# Patient Record
Sex: Male | Born: 1985 | Race: Black or African American | Hispanic: No | Marital: Single | Smoking: Never smoker
Health system: Southern US, Community
[De-identification: ages and names within clinical notes are randomized; demographics above are authoritative.]

## PROBLEM LIST (undated history)

## (undated) DIAGNOSIS — R519 Headache, unspecified: Secondary | ICD-10-CM

## (undated) HISTORY — PX: HERNIA REPAIR: SHX51

## (undated) HISTORY — DX: Headache, unspecified: R51.9

## (undated) HISTORY — PX: ASD REPAIR: SHX258

---
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
## 2008-05-09 ENCOUNTER — Ambulatory Visit: Payer: Self-pay | Admitting: Internal Medicine

## 2009-04-21 ENCOUNTER — Ambulatory Visit: Payer: Self-pay | Admitting: Internal Medicine

## 2010-02-27 ENCOUNTER — Emergency Department (HOSPITAL_COMMUNITY): Admission: EM | Admit: 2010-02-27 | Discharge: 2010-02-28 | Payer: Self-pay | Admitting: Emergency Medicine

## 2010-07-25 ENCOUNTER — Ambulatory Visit: Payer: Self-pay | Admitting: Internal Medicine

## 2010-08-03 ENCOUNTER — Ambulatory Visit
Admission: RE | Admit: 2010-08-03 | Discharge: 2010-08-03 | Payer: Self-pay | Source: Home / Self Care | Attending: Internal Medicine | Admitting: Internal Medicine

## 2010-09-03 ENCOUNTER — Ambulatory Visit: Admit: 2010-09-03 | Payer: Self-pay | Admitting: Internal Medicine

## 2010-09-04 ENCOUNTER — Ambulatory Visit: Admit: 2010-09-04 | Payer: Self-pay | Admitting: Internal Medicine

## 2010-09-06 ENCOUNTER — Encounter (INDEPENDENT_AMBULATORY_CARE_PROVIDER_SITE_OTHER): Payer: BC Managed Care – PPO | Admitting: Internal Medicine

## 2010-09-06 DIAGNOSIS — Z23 Encounter for immunization: Secondary | ICD-10-CM

## 2011-05-06 ENCOUNTER — Ambulatory Visit: Payer: BC Managed Care – PPO | Admitting: Internal Medicine

## 2011-05-06 ENCOUNTER — Encounter: Payer: Self-pay | Admitting: Internal Medicine

## 2012-04-27 ENCOUNTER — Telehealth: Payer: Self-pay | Admitting: Internal Medicine

## 2012-04-28 NOTE — Telephone Encounter (Signed)
There is no cream to remove them.  They're difficult to remove and can only be done by plastic surgeon by laser.  Pt notified.

## 2012-10-08 ENCOUNTER — Ambulatory Visit (INDEPENDENT_AMBULATORY_CARE_PROVIDER_SITE_OTHER): Payer: BC Managed Care – PPO | Admitting: Internal Medicine

## 2012-10-08 ENCOUNTER — Encounter: Payer: Self-pay | Admitting: Internal Medicine

## 2012-10-08 VITALS — BP 120/88 | Temp 97.0°F | Ht 69.0 in | Wt 163.0 lb

## 2012-10-08 DIAGNOSIS — Z9861 Coronary angioplasty status: Secondary | ICD-10-CM

## 2012-10-08 DIAGNOSIS — Z0000 Encounter for general adult medical examination without abnormal findings: Secondary | ICD-10-CM

## 2012-10-08 DIAGNOSIS — Z8774 Personal history of (corrected) congenital malformations of heart and circulatory system: Secondary | ICD-10-CM

## 2012-10-08 DIAGNOSIS — Z202 Contact with and (suspected) exposure to infections with a predominantly sexual mode of transmission: Secondary | ICD-10-CM

## 2012-10-08 DIAGNOSIS — Z113 Encounter for screening for infections with a predominantly sexual mode of transmission: Secondary | ICD-10-CM

## 2012-10-08 DIAGNOSIS — Z2089 Contact with and (suspected) exposure to other communicable diseases: Secondary | ICD-10-CM

## 2012-10-08 DIAGNOSIS — Z23 Encounter for immunization: Secondary | ICD-10-CM

## 2012-10-08 LAB — CBC WITH DIFFERENTIAL/PLATELET
Basophils Absolute: 0.1 10*3/uL (ref 0.0–0.1)
Basophils Relative: 1 % (ref 0–1)
Eosinophils Absolute: 0.3 10*3/uL (ref 0.0–0.7)
Eosinophils Relative: 6 % — ABNORMAL HIGH (ref 0–5)
HCT: 47.1 % (ref 39.0–52.0)
Hemoglobin: 15.5 g/dL (ref 13.0–17.0)
Lymphocytes Relative: 41 % (ref 12–46)
Lymphs Abs: 2.3 10*3/uL (ref 0.7–4.0)
MCH: 26.7 pg (ref 26.0–34.0)
MCHC: 32.9 g/dL (ref 30.0–36.0)
MCV: 81.1 fL (ref 78.0–100.0)
Monocytes Absolute: 0.3 10*3/uL (ref 0.1–1.0)
Monocytes Relative: 5 % (ref 3–12)
Neutro Abs: 2.6 10*3/uL (ref 1.7–7.7)
Neutrophils Relative %: 47 % (ref 43–77)
Platelets: 278 10*3/uL (ref 150–400)
RBC: 5.81 MIL/uL (ref 4.22–5.81)
RDW: 14.2 % (ref 11.5–15.5)
WBC: 5.6 10*3/uL (ref 4.0–10.5)

## 2012-10-08 LAB — POCT URINALYSIS DIPSTICK
Bilirubin, UA: NEGATIVE
Blood, UA: NEGATIVE
Glucose, UA: NEGATIVE
Ketones, UA: NEGATIVE
Leukocytes, UA: NEGATIVE
Nitrite, UA: NEGATIVE
Protein, UA: NEGATIVE
Spec Grav, UA: 1.01
Urobilinogen, UA: NEGATIVE
pH, UA: 7

## 2012-10-08 LAB — COMPREHENSIVE METABOLIC PANEL
ALT: 15 U/L (ref 0–53)
AST: 26 U/L (ref 0–37)
Albumin: 4.4 g/dL (ref 3.5–5.2)
Alkaline Phosphatase: 69 U/L (ref 39–117)
BUN: 18 mg/dL (ref 6–23)
CO2: 28 mEq/L (ref 19–32)
Calcium: 10.2 mg/dL (ref 8.4–10.5)
Chloride: 101 mEq/L (ref 96–112)
Creat: 1.01 mg/dL (ref 0.50–1.35)
Glucose, Bld: 87 mg/dL (ref 70–99)
Potassium: 4.4 mEq/L (ref 3.5–5.3)
Sodium: 136 mEq/L (ref 135–145)
Total Bilirubin: 0.6 mg/dL (ref 0.3–1.2)
Total Protein: 7 g/dL (ref 6.0–8.3)

## 2012-10-08 LAB — LIPID PANEL
Cholesterol: 143 mg/dL (ref 0–200)
HDL: 55 mg/dL (ref >39)
LDL Cholesterol: 78 mg/dL (ref 0–99)
Total CHOL/HDL Ratio: 2.6 Ratio
Triglycerides: 51 mg/dL (ref <150)
VLDL: 10 mg/dL (ref 0–40)

## 2012-10-08 LAB — HIV ANTIBODY (ROUTINE TESTING W REFLEX): HIV: NONREACTIVE

## 2012-10-08 MED ORDER — TETANUS-DIPHTH-ACELL PERTUSSIS 5-2.5-18.5 LF-MCG/0.5 IM SUSP: 0.5000 mL | Freq: Once | INTRAMUSCULAR | Status: DC

## 2012-10-08 NOTE — Patient Instructions (Addendum)
Use protection with sexual intercourse. Return one to 2 years or as needed

## 2012-10-08 NOTE — Progress Notes (Signed)
Subjective:    Patient ID: Jose Phillips, male    DOB: [birth year], 27 y.o.   MRN: [id number]  HPI  27 year old Black male attending  [name] has been a patient here since birth in [birth year]. I initially saw him as an infant at [facility]. He was a 6 lbs. 13 oz. product of normal pregnancy. He had bilateral inguinal hernia repair October 1989. He has bilateral arm tattoos that are being removed with laser treatment at [facility]. 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 He had developed hypopigmented patches on his body. Biopsy showed mononuclear cell infiltrate.  No chronic medications. No known drug allergies.  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 Thinks he might want to major in exercise physiology. Wants to help people and has thought about a career in medicine.  Had chickenpox in 1990. Had ASD  transcatheter closure at age 79 at [facility]. He had a large oval fossa ASD  He had hep B series completed in 2012. Although he started the series in 1999 we could not see that he had had but 3 of the vaccines by April 2000. Therefore he got a dose December 2011 and another dose February 2012.Tdap vaccine given today. Was diagnosed in 2009 with GC and chlamydia. Says he's been careful with sexual intercourse. Wants to be checked for HIV.  Family history: Father with history of hypertension. One sister in good health. Mother with history of lumbar back and cervical neck pain.    Review of Systems  Constitutional: Negative.   HENT: Negative.   Eyes: Negative.   Respiratory: Negative.   Cardiovascular: Negative.   Gastrointestinal: Negative.   Endocrine: Negative.   Genitourinary: Negative.   Allergic/Immunologic: Negative.   Neurological: Negative.   Hematological: Negative.   Psychiatric/Behavioral: Negative.        Objective:   Physical Exam  Vitals reviewed. Constitutional: He is oriented to  person, place, and time. He appears well-developed and well-nourished. No distress.  HENT:  Head: Normocephalic and atraumatic.  Right Ear: External ear normal.  Left Ear: External ear normal.  Mouth/Throat: Oropharynx is clear and moist. No oropharyngeal exudate.  Eyes: Conjunctivae and EOM are normal. Pupils are equal, round, and reactive to light. Right eye exhibits no discharge. Left eye exhibits no discharge.  Neck: Neck supple. No JVD present. No thyromegaly present.  Cardiovascular: Normal rate, regular rhythm, normal heart sounds and intact distal pulses.   No murmur heard. Pulmonary/Chest: Effort normal and breath sounds normal. He has no wheezes.  Abdominal: Soft. Bowel sounds are normal. He exhibits no distension and no mass. There is no tenderness. There is no rebound and no guarding.  Genitourinary:  Penis normal. Scrotum normal. No hernias to direct palpation.  Musculoskeletal: He exhibits no edema.  Lymphadenopathy:    He has no cervical adenopathy.  Neurological: He is alert and oriented to person, place, and time. He has normal reflexes. No cranial nerve deficit. Coordination normal.  Skin: Skin is warm and dry. He is not diaphoretic.  Psychiatric: He has a normal mood and affect. His behavior is normal. Judgment and thought content normal.          Assessment & Plan:  History of ASD transcatheter closure 1988 at age 80  History of GC and Chlamydia in 2009  Normal health maintenance exam-labs are pending  Plan: Tdap Vaccine given today return in 1-2  years or as needed

## 2012-10-09 LAB — GC/CHLAMYDIA PROBE AMP, URINE
Chlamydia, Swab/Urine, PCR: NEGATIVE
GC Probe Amp, Urine: NEGATIVE

## 2013-01-11 ENCOUNTER — Other Ambulatory Visit: Payer: Self-pay | Admitting: Internal Medicine

## 2013-01-11 ENCOUNTER — Ambulatory Visit (INDEPENDENT_AMBULATORY_CARE_PROVIDER_SITE_OTHER): Payer: BC Managed Care – PPO | Admitting: Internal Medicine

## 2013-01-11 ENCOUNTER — Encounter: Payer: Self-pay | Admitting: Internal Medicine

## 2013-01-11 VITALS — BP 112/68 | HR 70 | Temp 96.6°F | Wt 165.0 lb

## 2013-01-11 DIAGNOSIS — Z711 Person with feared health complaint in whom no diagnosis is made: Secondary | ICD-10-CM

## 2013-01-11 NOTE — Progress Notes (Signed)
  Subjective:    Patient ID: Jose Phillips, male    DOB: October 01, 1985, 27 y.o.   MRN: [id number]  HPI 27 year old Black male in today for STD testing. Says he is not having as many partners as previously. Says he is using condoms. However he wants to be checked for STDs. No penile discharge.  He is majoring in business at [employer] with a concentration and exercise physiology. He wants to work in the hospital. I talked with him about going to physicians assistant school but he would have to take chemistry. He might consider getting an MBA.    Review of Systems     Objective:   Physical Exam not examined. He was screened today for hepatitis C and HIV via lab test. He did not want to have probes for GC and chlamydia. He agreed to do urine specimen for these.        Assessment & Plan:  STD testing  Plan: Await results.  Time spent with patient 15 minutes

## 2013-01-11 NOTE — Patient Instructions (Addendum)
Await test results. Remember to use condoms.

## 2013-01-12 ENCOUNTER — Telehealth: Payer: Self-pay | Admitting: *Deleted

## 2013-01-12 LAB — HIV ANTIBODY (ROUTINE TESTING W REFLEX): HIV: NONREACTIVE

## 2013-01-12 LAB — NEISSERIA GONORRHOEAE, PROBE AMP: GC Probe RNA: NEGATIVE

## 2013-01-12 NOTE — Telephone Encounter (Signed)
LVM, advising that per Dr. Lenord Fellers all labs were normal. He called back and confirmed results with Marcelino Duster. KW

## 2013-01-13 LAB — GC/CHLAMYDIA PROBE AMP, URINE: Chlamydia, Swab/Urine, PCR: NEGATIVE

## 2013-10-06 ENCOUNTER — Telehealth: Payer: Self-pay

## 2013-10-06 NOTE — Telephone Encounter (Signed)
States she believes her son is having migraine headaches. Has had 3 in the past few days. excedrin migraine not much help. Wanted to make appointment for him, which we did for tomorrrow, but advised her to take him to urgent care if he has nausea, vomiting, photophobia or blurred vision. She voices understanding.

## 2013-10-07 ENCOUNTER — Other Ambulatory Visit: Payer: Self-pay

## 2013-10-07 ENCOUNTER — Ambulatory Visit
Admission: RE | Admit: 2013-10-07 | Discharge: 2013-10-07 | Disposition: A | Discharge status: Home / Self Care | Payer: BC Managed Care – PPO | Source: Ambulatory Visit | Attending: Internal Medicine | Admitting: Internal Medicine

## 2013-10-07 ENCOUNTER — Ambulatory Visit (INDEPENDENT_AMBULATORY_CARE_PROVIDER_SITE_OTHER): Payer: BC Managed Care – PPO | Admitting: Internal Medicine

## 2013-10-07 ENCOUNTER — Encounter: Payer: Self-pay | Admitting: Internal Medicine

## 2013-10-07 VITALS — BP 116/90 | HR 72 | Temp 97.6°F | Ht 70.25 in | Wt 176.0 lb

## 2013-10-07 DIAGNOSIS — G43901 Migraine, unspecified, not intractable, with status migrainosus: Secondary | ICD-10-CM

## 2013-10-07 DIAGNOSIS — Z8669 Personal history of other diseases of the nervous system and sense organs: Secondary | ICD-10-CM

## 2013-10-07 DIAGNOSIS — R519 Headache, unspecified: Secondary | ICD-10-CM

## 2013-10-07 DIAGNOSIS — R51 Headache: Secondary | ICD-10-CM

## 2013-10-07 DIAGNOSIS — Z202 Contact with and (suspected) exposure to infections with a predominantly sexual mode of transmission: Secondary | ICD-10-CM

## 2013-10-07 LAB — CBC WITH DIFFERENTIAL/PLATELET
BASOS ABS: 0 10*3/uL (ref 0.0–0.1)
Basophils Relative: 1 % (ref 0–1)
EOS ABS: 0.2 10*3/uL (ref 0.0–0.7)
EOS PCT: 4 % (ref 0–5)
HCT: 43.7 % (ref 39.0–52.0)
Hemoglobin: 14.7 g/dL (ref 13.0–17.0)
LYMPHS PCT: 38 % (ref 12–46)
Lymphs Abs: 1.7 10*3/uL (ref 0.7–4.0)
MCH: 27 pg (ref 26.0–34.0)
MCHC: 33.6 g/dL (ref 30.0–36.0)
MCV: 80.3 fL (ref 78.0–100.0)
Monocytes Absolute: 0.2 10*3/uL (ref 0.1–1.0)
Monocytes Relative: 5 % (ref 3–12)
Neutro Abs: 2.3 10*3/uL (ref 1.7–7.7)
Neutrophils Relative %: 52 % (ref 43–77)
PLATELETS: 251 10*3/uL (ref 150–400)
RBC: 5.44 MIL/uL (ref 4.22–5.81)
RDW: 14.2 % (ref 11.5–15.5)
WBC: 4.4 10*3/uL (ref 4.0–10.5)

## 2013-10-07 LAB — COMPREHENSIVE METABOLIC PANEL
ALT: 17 U/L (ref 0–53)
AST: 28 U/L (ref 0–37)
Albumin: 4.5 g/dL (ref 3.5–5.2)
Alkaline Phosphatase: 68 U/L (ref 39–117)
BILIRUBIN TOTAL: 0.6 mg/dL (ref 0.2–1.2)
BUN: 15 mg/dL (ref 6–23)
CO2: 28 mEq/L (ref 19–32)
CREATININE: 1.06 mg/dL (ref 0.50–1.35)
Calcium: 9.7 mg/dL (ref 8.4–10.5)
Chloride: 103 mEq/L (ref 96–112)
Glucose, Bld: 88 mg/dL (ref 70–99)
Potassium: 4.4 mEq/L (ref 3.5–5.3)
Sodium: 141 mEq/L (ref 135–145)
Total Protein: 7 g/dL (ref 6.0–8.3)

## 2013-10-07 MED ORDER — IOHEXOL 300 MG/ML  SOLN
75.0000 mL | Freq: Once | INTRAMUSCULAR | Status: AC | PRN
  Administered 2013-10-07: 75 mL via INTRAVENOUS

## 2013-10-08 LAB — GC/CHLAMYDIA PROBE AMP, URINE
CHLAMYDIA, SWAB/URINE, PCR: NEGATIVE
GC Probe Amp, Urine: NEGATIVE

## 2013-10-08 LAB — SEDIMENTATION RATE: SED RATE: 1 mm/h (ref 0–16)

## 2013-10-12 ENCOUNTER — Other Ambulatory Visit: Payer: Self-pay

## 2013-10-24 DIAGNOSIS — Z8669 Personal history of other diseases of the nervous system and sense organs: Secondary | ICD-10-CM | POA: Insufficient documentation

## 2013-10-24 NOTE — Patient Instructions (Signed)
Have CT of the brain. Lab work is pending. Take Norco 5/325 as needed for headache. Steroid dosepak for headache. In the future if have migraine, take ibuprofen immediately and if no relief try Relpax. Get plenty of sleep. Avoid alcohol and other food triggers.

## 2013-10-24 NOTE — Progress Notes (Signed)
   Subjective:    Patient ID: Jose Phillips, male    DOB: 08-02-1986, 28 y.o.   MRN: [id number]  HPI 28 year old [place] male, student at [employer], in today with several episodes of severe headaches recently. One episode had onset after drinking some wine in a movie theater. Patient has never had migraine headaches previously. He has noticed photophobia with these headaches. He was not aware that diet could contribute to the headaches until I spoke with him about this today. Describes headache today  as severe. Cannot get relief with over-the-counter medication. Some nausea but no vomiting. Headache described as pounding. Denies being worried about anything that would trigger these headaches. Says he's getting enough sleep. However he goes to school full-time and also works at [employer].  Social history: Single, sexually active. Social alcohol consumption. Does not smoke.  Family history: Maternal grandmother with history of dementia and diabetes. Mother with history of lumbar and cervical neck pain with radiculopathy as well as migraine headaches. Father with history of hypertension. One sister in good health.  Had hepatitis B series . Has positive aerosol titer. Tested positive for gonorrhea and Chlamydia in 2009 and was treated . HIV 2009 was nonreactive.  Patient had bilateral indirect inguinal hernias repaired by Dr. Levie HeritagePendse in 1989.  In 2001 he had a routine physical exam and a murmur was heard. He was asymptomatic. Subsequently was found to have atrial septal defect upon referral to pediatric cardiologist. He underwent closure of ASD June 2002 at 28 years of age. This was a transcatheter closure with an Amplatzer septal occluder due to a large oval fossa ASD. Her    Review of Systems     Objective:   Physical Exam skin warm and dry. Nodes none. HEENT exam: PERRLA TMs and pharynx are clear. Funduscopic exam is benign with as being sharp and flat bilaterally. Vasculature is  normal. Neck is supple. No adenopathy. Chest clear. Cardiac exam regular rate and rhythm normal S1 and S2. Abdomen no hepatosplenomegaly masses or tenderness. Neurological exam cranial nerves II through XII are grossly intact. Muscle strength is normal in upper and lower sternum these. Sensation is intact. Cerebellar finger to nose testing is normal. Gait is normal. Alert and oriented x3.        Assessment & Plan:  Recent onset migraine headaches-given food list that can cause migraine headaches. Reviewed these with him.  History of ASD repair  History of STDs  Plan: Patient was to be rechecked for STDs. Given a course of steroids for migraine headache. If migraines recur, try to take ibuprofen and if no relief, try Relpax as soon as possible- samples provided. Norco 5/325 every 4-6 hours when necessary pain. Out of work today. Obtain brain CT with and without contrast.  Addendum: CT of the brain with and without contrast is negative. STD checks negative.

## 2014-08-30 ENCOUNTER — Ambulatory Visit (INDEPENDENT_AMBULATORY_CARE_PROVIDER_SITE_OTHER): Payer: BLUE CROSS/BLUE SHIELD | Admitting: Internal Medicine

## 2014-08-30 ENCOUNTER — Encounter: Payer: Self-pay | Admitting: Internal Medicine

## 2014-08-30 VITALS — BP 116/82 | HR 89 | Temp 98.4°F | Ht 70.0 in | Wt 165.0 lb

## 2014-08-30 DIAGNOSIS — Z13220 Encounter for screening for lipoid disorders: Secondary | ICD-10-CM

## 2014-08-30 DIAGNOSIS — Z113 Encounter for screening for infections with a predominantly sexual mode of transmission: Secondary | ICD-10-CM

## 2014-08-30 DIAGNOSIS — Z0000 Encounter for general adult medical examination without abnormal findings: Secondary | ICD-10-CM

## 2014-08-30 LAB — CBC WITH DIFFERENTIAL/PLATELET
Basophils Absolute: 0.1 10*3/uL (ref 0.0–0.1)
Basophils Relative: 1 % (ref 0–1)
EOS PCT: 4 % (ref 0–5)
Eosinophils Absolute: 0.2 10*3/uL (ref 0.0–0.7)
HEMATOCRIT: 47.8 % (ref 39.0–52.0)
HEMOGLOBIN: 15.7 g/dL (ref 13.0–17.0)
LYMPHS ABS: 1.9 10*3/uL (ref 0.7–4.0)
LYMPHS PCT: 38 % (ref 12–46)
MCH: 26.9 pg (ref 26.0–34.0)
MCHC: 32.8 g/dL (ref 30.0–36.0)
MCV: 81.8 fL (ref 78.0–100.0)
MONO ABS: 0.3 10*3/uL (ref 0.1–1.0)
MPV: 9.2 fL (ref 8.6–12.4)
Monocytes Relative: 6 % (ref 3–12)
NEUTROS PCT: 51 % (ref 43–77)
Neutro Abs: 2.6 10*3/uL (ref 1.7–7.7)
Platelets: 253 10*3/uL (ref 150–400)
RBC: 5.84 MIL/uL — ABNORMAL HIGH (ref 4.22–5.81)
RDW: 14.3 % (ref 11.5–15.5)
WBC: 5 10*3/uL (ref 4.0–10.5)

## 2014-08-30 LAB — LIPID PANEL
CHOLESTEROL: 152 mg/dL (ref 0–200)
HDL: 60 mg/dL (ref >39)
LDL CALC: 81 mg/dL (ref 0–99)
TRIGLYCERIDES: 54 mg/dL (ref <150)
Total CHOL/HDL Ratio: 2.5 Ratio
VLDL: 11 mg/dL (ref 0–40)

## 2014-08-30 LAB — COMPREHENSIVE METABOLIC PANEL
ALT: 19 U/L (ref 0–53)
AST: 27 U/L (ref 0–37)
Albumin: 4.6 g/dL (ref 3.5–5.2)
Alkaline Phosphatase: 72 U/L (ref 39–117)
BILIRUBIN TOTAL: 0.9 mg/dL (ref 0.2–1.2)
BUN: 18 mg/dL (ref 6–23)
CALCIUM: 10.3 mg/dL (ref 8.4–10.5)
CO2: 28 mEq/L (ref 19–32)
CREATININE: 1.19 mg/dL (ref 0.50–1.35)
Chloride: 102 mEq/L (ref 96–112)
Glucose, Bld: 87 mg/dL (ref 70–99)
Potassium: 4.7 mEq/L (ref 3.5–5.3)
Sodium: 138 mEq/L (ref 135–145)
Total Protein: 7.2 g/dL (ref 6.0–8.3)

## 2014-08-30 LAB — RPR

## 2014-08-30 LAB — HEPATITIS C ANTIBODY: HCV AB: NEGATIVE

## 2014-08-30 LAB — HIV ANTIBODY (ROUTINE TESTING W REFLEX): HIV 1&2 Ab, 4th Generation: NONREACTIVE

## 2014-08-30 NOTE — Progress Notes (Signed)
   Subjective:    Patient ID: Jose Phillips, male    DOB: Dec 26, 1985, 29 y.o.   MRN: [id number]  HPI  29 year old Black male for health maintenance exam. Wants to be tested for STDs. Denies any symptoms of penile discharge or dysuria.  Past medical history: He has been a patient here since birth in [birth year]. I [facility] him as an infant at [facility]. He was a 6 lbs. 13 oz. product of a normal pregnancy. He had bilateral inguinal hernia repair October 1989. History of mycosis fungoides diagnosed at [facility] in [facility]. He has hypopigmented patches on his body. [facility] showed mononuclear cell infiltrate.  No chronic medications. No known drug allergies.  Social history: He is single. He graduated from [employer]  with a concentration in business and is now in school studying for an [employer] degree. He works at [employer]. He works out a great deal and eats correctly. He is in excellent physical condition. Does not drink alcohol, smoke, or use drugs.  Family history: Father with history of hypertension. Mother with history of lumbar stenosis. One sister with history of narcolepsy.  History of chickenpox  in 641990. He had an ASD transcatheter closure at age 29 [facility] [employer]. He had a large oval fossa ASD.  Tetanus immunization update given in 2012.  Has not taken influenza vaccine. Recommended he obtain one at pharmacy. We are out of that vaccine.  Had GC and chlamydia in 2009.    Review of Systems  Constitutional: Negative.   All other systems reviewed and are negative.      Objective:   Physical Exam  Constitutional: He is oriented to person, place, and time. He appears well-developed and well-nourished. No distress.  HENT:  Head: Normocephalic and atraumatic.  Right Ear: External ear normal.  Left Ear: External ear normal.  Mouth/Throat: Oropharynx is clear and moist. No oropharyngeal exudate.  Eyes: Conjunctivae and EOM are normal. Pupils are equal, round, and  reactive to light. Right eye exhibits no discharge. Left eye exhibits no discharge. No scleral icterus.  Neck: Neck supple. No JVD present. No thyromegaly present.  Cardiovascular: Normal rate, regular rhythm, normal heart sounds and intact distal pulses.   No murmur heard. Pulmonary/Chest: Effort normal and breath sounds normal. No respiratory distress. He has no wheezes. He has no rales. He exhibits no tenderness.  Abdominal: Soft. Bowel sounds are normal. He exhibits no distension and no mass. There is no tenderness. There is no rebound and no guarding.  Genitourinary:  Penis normal. No hernias to direct palpation.  Musculoskeletal: He exhibits no edema.  Lymphadenopathy:    He has no cervical adenopathy.  Neurological: He is alert and oriented to person, place, and time. He has normal reflexes. No cranial nerve deficit.  Skin: Skin is warm and dry. No rash noted. He is not diaphoretic.  Tattoos on arms. Skin is dry.  Psychiatric: He has a normal mood and affect. His behavior is normal. Judgment and thought content normal.  Vitals reviewed.         Assessment & Plan:  History of ASD closure as a child  Normal health maintenance exam  History of mycosis fungoides  Screen for STD at patient request. He request a number of STD tests today. Counsel regarding condom use.  Plan: Return in 2 years or as needed.

## 2014-08-30 NOTE — Patient Instructions (Addendum)
Return 2 years or as needed. Lab work is pending.

## 2014-08-31 LAB — GC/CHLAMYDIA PROBE AMP
CT Probe RNA: NEGATIVE
GC Probe RNA: NEGATIVE

## 2014-08-31 LAB — HSV 1 ANTIBODY, IGG

## 2014-08-31 LAB — HSV 2 ANTIBODY, IGG: HSV 2 GLYCOPROTEIN G AB, IGG: 8.87 IV — AB

## 2014-11-14 ENCOUNTER — Telehealth: Payer: Self-pay | Admitting: *Deleted

## 2014-11-14 NOTE — Telephone Encounter (Signed)
Jose Phillips called and would like to speak to you directly.Questions regarding recent labs done in January. Didn't want to speak to me about it . He would like a call at [phone] .

## 2014-11-14 NOTE — Telephone Encounter (Signed)
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 He has never had an outbreak to his knowledge. He did want this blood test buddies never had an outbreak. GC and Chlamydia probes were negative. HIV was negative. He doesn't always use a condom when having intercourse. He's been advised in the past to do so. He has some concerns and wants his penis checked and will come in next week.

## 2014-11-24 ENCOUNTER — Ambulatory Visit: Payer: BLUE CROSS/BLUE SHIELD | Admitting: Internal Medicine

## 2015-06-26 ENCOUNTER — Telehealth: Payer: Self-pay | Admitting: Internal Medicine

## 2015-06-26 NOTE — Telephone Encounter (Signed)
Printed and ready for pick up. 

## 2015-06-26 NOTE — Telephone Encounter (Signed)
Pt would like to pick up immunization record for school.  Patient will pick up 06/27/15.  Best number to call if needed [phone]

## 2016-02-26 NOTE — Progress Notes (Signed)
Erroneous encounter

## 2016-05-06 ENCOUNTER — Ambulatory Visit (INDEPENDENT_AMBULATORY_CARE_PROVIDER_SITE_OTHER): Payer: BLUE CROSS/BLUE SHIELD | Admitting: Internal Medicine

## 2016-05-06 ENCOUNTER — Encounter: Payer: Self-pay | Admitting: Internal Medicine

## 2016-05-06 VITALS — BP 116/78 | HR 88 | Temp 97.8°F | Ht 70.0 in | Wt 158.0 lb

## 2016-05-06 DIAGNOSIS — Z202 Contact with and (suspected) exposure to infections with a predominantly sexual mode of transmission: Secondary | ICD-10-CM | POA: Diagnosis not present

## 2016-05-08 ENCOUNTER — Telehealth: Payer: Self-pay | Admitting: Internal Medicine

## 2016-05-08 NOTE — Telephone Encounter (Signed)
States that he DOES want you to run the test that you spoke about when he was in the office on Monday, 10/2.    Please advise.

## 2016-05-09 NOTE — Telephone Encounter (Signed)
Spoke with Dr. Lenord FellersBaxley and she advised that she and the patient spoke about Hep C, HIV, Gonorrhea & Chlamydia.  There is not a test for Warts.  Patient can come and have these tests done, doesn't need to see Dr. Lenord FellersBaxley.    Spoke with patient and advised per Dr. Beryle QuantBaxley's instructions.  Patient would like to have ALL of the tests performed.  Patient provided with lab appointment for Friday, 10/6.  Patient instructed to arrive between 9-12 on 10/6.  Patient doesn't have to fast for these labs.  Patient will also provide a urine sample at the time of the lab appointment for the [facility].

## 2016-05-16 ENCOUNTER — Other Ambulatory Visit: Payer: BLUE CROSS/BLUE SHIELD | Admitting: Internal Medicine

## 2016-05-23 ENCOUNTER — Other Ambulatory Visit (INDEPENDENT_AMBULATORY_CARE_PROVIDER_SITE_OTHER): Payer: BLUE CROSS/BLUE SHIELD | Admitting: Internal Medicine

## 2016-05-23 DIAGNOSIS — Z113 Encounter for screening for infections with a predominantly sexual mode of transmission: Secondary | ICD-10-CM | POA: Diagnosis not present

## 2016-05-23 DIAGNOSIS — Z23 Encounter for immunization: Secondary | ICD-10-CM

## 2016-05-24 LAB — GC/CHLAMYDIA PROBE AMP
CT Probe RNA: NOT DETECTED
GC Probe RNA: NOT DETECTED

## 2016-05-24 LAB — HEPATITIS C ANTIBODY: HCV Ab: NEGATIVE

## 2016-05-24 LAB — HIV ANTIBODY (ROUTINE TESTING W REFLEX): HIV: NONREACTIVE

## 2016-06-01 NOTE — Progress Notes (Signed)
   Subjective:    Patient ID: Jose SanteeWayne Jamel Warn, male    DOB: [birth year], 30 y.o.   MRN: [id number]  HPI Patient admits he has had unprotected sex with of a number of women. He has some concerns about STDs. He's been tested before.  He asked for herpes simplex virus testing in January 2016. 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 This is confusing to him. Explained to him that his body had seen the virus but he has had no acute illness or recurrent infections apparently.  He's had  HIV and Hepatitis C testing in January 2016 both of which were negative. RPR was negative at that same time. GC and Chlamydia probes were negative at that time.    Review of Systems see above denies penile discharge, warts, symptoms of herpes simplex     Objective:   Physical Exam  No penile discharge or genital warts noted.      Assessment & Plan:  Spent 30 minutes speaking with him about these issues once again. Says he wants to come in in the near future and get a complete physical and have more testing.

## 2016-06-01 NOTE — Patient Instructions (Signed)
Patient is to consider repeat STD testing in the near future.

## 2016-07-19 ENCOUNTER — Other Ambulatory Visit: Payer: BLUE CROSS/BLUE SHIELD | Admitting: Internal Medicine

## 2016-07-19 DIAGNOSIS — Z130 Encounter for screening for diseases of the blood and blood-forming organs and certain disorders involving the immune mechanism: Secondary | ICD-10-CM

## 2016-07-19 DIAGNOSIS — Z13220 Encounter for screening for lipoid disorders: Secondary | ICD-10-CM

## 2016-07-19 DIAGNOSIS — Z0000 Encounter for general adult medical examination without abnormal findings: Secondary | ICD-10-CM

## 2016-07-19 LAB — CBC WITH DIFFERENTIAL/PLATELET
BASOS ABS: 63 {cells}/uL (ref 0–200)
Basophils Relative: 1 %
EOS ABS: 315 {cells}/uL (ref 15–500)
EOS PCT: 5 %
HCT: 44.1 % (ref 38.5–50.0)
Hemoglobin: 14.8 g/dL (ref 13.2–17.1)
LYMPHS PCT: 35 %
Lymphs Abs: 2205 cells/uL (ref 850–3900)
MCH: 27.6 pg (ref 27.0–33.0)
MCHC: 33.6 g/dL (ref 32.0–36.0)
MCV: 82.3 fL (ref 80.0–100.0)
MONOS PCT: 6 %
MPV: 9.2 fL (ref 7.5–12.5)
Monocytes Absolute: 378 cells/uL (ref 200–950)
NEUTROS ABS: 3339 {cells}/uL (ref 1500–7800)
NEUTROS PCT: 53 %
PLATELETS: 248 10*3/uL (ref 140–400)
RBC: 5.36 MIL/uL (ref 4.20–5.80)
RDW: 14 % (ref 11.0–15.0)
WBC: 6.3 10*3/uL (ref 3.8–10.8)

## 2016-07-19 LAB — LIPID PANEL
CHOL/HDL RATIO: 1.9 ratio (ref <5.0)
Cholesterol: 124 mg/dL (ref <200)
HDL: 65 mg/dL (ref >40)
LDL CALC: 52 mg/dL (ref <100)
TRIGLYCERIDES: 35 mg/dL (ref <150)
VLDL: 7 mg/dL (ref <30)

## 2016-07-19 LAB — COMPLETE METABOLIC PANEL WITH GFR
ALT: 15 U/L (ref 9–46)
AST: 32 U/L (ref 10–40)
Albumin: 4.3 g/dL (ref 3.6–5.1)
Alkaline Phosphatase: 71 U/L (ref 40–115)
BILIRUBIN TOTAL: 0.8 mg/dL (ref 0.2–1.2)
BUN: 21 mg/dL (ref 7–25)
CO2: 26 mmol/L (ref 20–31)
Calcium: 9.8 mg/dL (ref 8.6–10.3)
Chloride: 104 mmol/L (ref 98–110)
Creat: 1.14 mg/dL (ref 0.60–1.35)
GFR, EST NON AFRICAN AMERICAN: 86 mL/min (ref >=60)
GLUCOSE: 73 mg/dL (ref 65–99)
POTASSIUM: 4.3 mmol/L (ref 3.5–5.3)
SODIUM: 139 mmol/L (ref 135–146)
Total Protein: 6.8 g/dL (ref 6.1–8.1)

## 2016-07-22 ENCOUNTER — Encounter: Payer: Self-pay | Admitting: Internal Medicine

## 2016-07-22 ENCOUNTER — Ambulatory Visit (INDEPENDENT_AMBULATORY_CARE_PROVIDER_SITE_OTHER): Payer: BLUE CROSS/BLUE SHIELD | Admitting: Internal Medicine

## 2016-07-22 VITALS — BP 128/82 | HR 92 | Temp 97.9°F | Ht 69.0 in | Wt 156.0 lb

## 2016-07-22 DIAGNOSIS — Z0000 Encounter for general adult medical examination without abnormal findings: Secondary | ICD-10-CM

## 2016-07-22 LAB — POCT URINALYSIS DIPSTICK
Bilirubin, UA: NEGATIVE
Glucose, UA: NEGATIVE
Ketones, UA: NEGATIVE
Leukocytes, UA: NEGATIVE
NITRITE UA: NEGATIVE
PH UA: 7.5
Protein, UA: NEGATIVE
RBC UA: NEGATIVE
UROBILINOGEN UA: NEGATIVE

## 2016-08-04 NOTE — Progress Notes (Signed)
   Subjective:    Patient ID: Jose Phillips, male    DOB: 13-Mar-1986, 30 y.o.   MRN: [id number]  HPI Pleasant 30 year old Black Male in today for health maintenance exam. His general health is excellent. He stays in good shape and works out quite a bit.  He has had STD exposure in the past and was concerned about this. 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 He requested this test. Admits that it one point he was not using protection with sexual intercourse. Had multiple partners.  In October he had STD screening including HIV which was negative, hep C which was negative GC and chlamydia urine test which was negative.  He has been a patient here since birth in [birth year]. [facility] saw him as an infant at [facility]. He was a 6 lbs. 13 oz. product of a normal pregnancy.  He had bilateral inguinal hernia repair October 1989.  History of mycosis fungoides diagnosed at [facility] in 2009. History of hypopigmented patches on his body. [facility]opsy [facility] showed mononuclear cell infiltrate.  Had chickenpox in 1990.  He [facility] age 89 [facility]. He had a large oval fossa ASD.  Tetanus immunization given 2012.  He had GC and chlamydia in 2009.  No chronic medications.  No known drug allergies.  Social history: He is single. He graduated from [employer] with a concentration and business and is now studying for a Masters degree. He works at [employer]. Does not drink alcohol, smoke or use drugs.  Family history: Father with history of hypertension. Mother with history of lumbar stenosis and migraine headaches. One sister with history of possible narcolepsy.      Review of Systems  Constitutional: Negative.   All other systems reviewed and are negative.      Objective:   Physical Exam  Constitutional: He is oriented to person, place, and time. He appears well-developed and  well-nourished.  HENT:  Head: Normocephalic and atraumatic.  Right Ear: External ear normal.  Left Ear: External ear normal.  Mouth/Throat: Oropharynx is clear and moist.  Eyes: Conjunctivae and EOM are normal. Pupils are equal, round, and reactive to light. Right eye exhibits no discharge. Left eye exhibits no discharge. No scleral icterus.  Neck: Neck supple. No JVD present. No thyromegaly present.  Cardiovascular: Normal rate and regular rhythm.   No murmur heard. Pulmonary/Chest: Effort normal and breath sounds normal.  Abdominal: Soft. Bowel sounds are normal. He exhibits no distension and no mass. There is no tenderness. There is no rebound and no guarding.  Genitourinary: Penis normal.  Genitourinary Comments: No hernias to direct palpation. No warty lesions on penis. No penile discharge.  Musculoskeletal: He exhibits no edema.  Lymphadenopathy:    He has no cervical adenopathy.  Neurological: He is alert and oriented to person, place, and time. He has normal reflexes. No cranial nerve deficit. Coordination normal.  Skin: Skin is warm and dry.  Tattoos on arms  Vitals reviewed.         Assessment & Plan:  Normal health maintenance exam  Plan: Return in 2 years or as needed. Lab work reviewed and is within normal limits.

## 2016-08-04 NOTE — Patient Instructions (Addendum)
It was a pleasure to see you today. Continue to protection with sexual intercourse. Return in 2 years or when necessary.

## 2017-03-04 ENCOUNTER — Encounter: Payer: Self-pay | Admitting: Internal Medicine

## 2017-03-04 ENCOUNTER — Ambulatory Visit (INDEPENDENT_AMBULATORY_CARE_PROVIDER_SITE_OTHER): Payer: BLUE CROSS/BLUE SHIELD | Admitting: Internal Medicine

## 2017-03-04 VITALS — BP 110/76 | HR 82 | Temp 97.8°F | Wt 168.0 lb

## 2017-03-04 DIAGNOSIS — J22 Unspecified acute lower respiratory infection: Secondary | ICD-10-CM | POA: Diagnosis not present

## 2017-03-04 DIAGNOSIS — H6693 Otitis media, unspecified, bilateral: Secondary | ICD-10-CM | POA: Diagnosis not present

## 2017-03-04 MED ORDER — AMOXICILLIN 500 MG PO CAPS: 500.0000 mg | ORAL_CAPSULE | Freq: Three times a day (TID) | ORAL | 0 refills | Status: DC

## 2017-03-04 NOTE — Patient Instructions (Signed)
Amoxicillin 500 mg 3 times daily for 10 days. Delsym every 12 hours as needed for cough. Rest and drink plenty of fluids. Out of work this evening tomorrow and Thursday. Note provided.

## 2017-03-04 NOTE — Progress Notes (Signed)
   Subjective:    Patient ID: Jose Phillips, male    DOB: 01-13-1986, 31 y.o.   MRN: [id number]  HPI 31 year old Black Male student at [employer] and also working at [employer] as been coughing for month. No fever or shaking chills. Tried Robitussin without relief.    Review of Systems see above     Objective:   Physical Exam  Skin warm and dry. Nodes none. TMs are full and pink bilaterally. Pharynx is clear. Neck is supple. Chest clear to auscultation but he is coughing quite a bit in the office.      Assessment & Plan:  Acute lower respiratory tract infection-no evidence of pneumonia on exam  Acute bilateral otitis media  Plan: Amoxicillin 500 mg 3 times daily for 10 days. Take Delsym DEL S1 IM every 12 hours for cough. Rest and drink plenty of fluids. Note to be out of work for 3 days at [employer].

## 2017-03-19 ENCOUNTER — Telehealth: Payer: Self-pay | Admitting: Internal Medicine

## 2017-03-19 MED ORDER — DOXYCYCLINE HYCLATE 100 MG PO TABS: 100.0000 mg | ORAL_TABLET | Freq: Two times a day (BID) | ORAL | 0 refills | Status: DC

## 2017-03-19 NOTE — Telephone Encounter (Signed)
Patient states that he has finished the Amoxicillin Rx that you prescribed but he is still pretty full of mucous and congestion.  He's still coughing and moving sputum.  He wants to know if you feel he needs another round of antibiotics?     [facility] [address]  Best # for contact:  [phone]

## 2017-03-19 NOTE — Telephone Encounter (Signed)
Pt is aware.  

## 2017-03-19 NOTE — Telephone Encounter (Signed)
Change to Doxycycline 100 mg twice a day x 10 days

## 2017-10-29 ENCOUNTER — Other Ambulatory Visit: Payer: Self-pay | Admitting: Internal Medicine

## 2017-10-29 DIAGNOSIS — Z0000 Encounter for general adult medical examination without abnormal findings: Secondary | ICD-10-CM

## 2017-10-29 DIAGNOSIS — Z13220 Encounter for screening for lipoid disorders: Secondary | ICD-10-CM

## 2017-11-06 ENCOUNTER — Other Ambulatory Visit: Payer: BLUE CROSS/BLUE SHIELD | Admitting: Internal Medicine

## 2017-11-07 ENCOUNTER — Other Ambulatory Visit: Payer: BLUE CROSS/BLUE SHIELD | Admitting: Internal Medicine

## 2017-11-07 DIAGNOSIS — Z0000 Encounter for general adult medical examination without abnormal findings: Secondary | ICD-10-CM

## 2017-11-07 DIAGNOSIS — Z13220 Encounter for screening for lipoid disorders: Secondary | ICD-10-CM

## 2017-11-07 LAB — COMPLETE METABOLIC PANEL WITH GFR
AG Ratio: 2 (calc) (ref 1.0–2.5)
ALT: 16 U/L (ref 9–46)
AST: 26 U/L (ref 10–40)
Albumin: 4.7 g/dL (ref 3.6–5.1)
Alkaline phosphatase (APISO): 63 U/L (ref 40–115)
BUN: 18 mg/dL (ref 7–25)
CO2: 29 mmol/L (ref 20–32)
CREATININE: 1.13 mg/dL (ref 0.60–1.35)
Calcium: 10 mg/dL (ref 8.6–10.3)
Chloride: 106 mmol/L (ref 98–110)
GFR, Est African American: 100 mL/min/{1.73_m2} (ref >=60)
GFR, Est Non African American: 86 mL/min/{1.73_m2} (ref >=60)
Globulin: 2.4 g/dL (calc) (ref 1.9–3.7)
Glucose, Bld: 81 mg/dL (ref 65–99)
POTASSIUM: 4.5 mmol/L (ref 3.5–5.3)
Sodium: 140 mmol/L (ref 135–146)
Total Bilirubin: 0.8 mg/dL (ref 0.2–1.2)
Total Protein: 7.1 g/dL (ref 6.1–8.1)

## 2017-11-07 LAB — CBC WITH DIFFERENTIAL/PLATELET
BASOS PCT: 1.4 %
Basophils Absolute: 59 cells/uL (ref 0–200)
EOS ABS: 210 {cells}/uL (ref 15–500)
Eosinophils Relative: 5 %
HCT: 46.8 % (ref 38.5–50.0)
HEMOGLOBIN: 15.8 g/dL (ref 13.2–17.1)
LYMPHS ABS: 1999 {cells}/uL (ref 850–3900)
MCH: 27.2 pg (ref 27.0–33.0)
MCHC: 33.8 g/dL (ref 32.0–36.0)
MCV: 80.6 fL (ref 80.0–100.0)
MONOS PCT: 6 %
MPV: 10 fL (ref 7.5–12.5)
NEUTROS ABS: 1680 {cells}/uL (ref 1500–7800)
Neutrophils Relative %: 40 %
Platelets: 285 10*3/uL (ref 140–400)
RBC: 5.81 10*6/uL — ABNORMAL HIGH (ref 4.20–5.80)
RDW: 13 % (ref 11.0–15.0)
Total Lymphocyte: 47.6 %
WBC: 4.2 10*3/uL (ref 3.8–10.8)
WBCMIX: 252 {cells}/uL (ref 200–950)

## 2017-11-07 LAB — LIPID PANEL
CHOL/HDL RATIO: 2.3 (calc) (ref <5.0)
Cholesterol: 149 mg/dL (ref <200)
HDL: 66 mg/dL (ref >40)
LDL Cholesterol (Calc): 69 mg/dL (calc)
NON-HDL CHOLESTEROL (CALC): 83 mg/dL (ref <130)
Triglycerides: 48 mg/dL (ref <150)

## 2017-11-11 ENCOUNTER — Encounter: Payer: BLUE CROSS/BLUE SHIELD | Admitting: Internal Medicine

## 2018-01-30 ENCOUNTER — Ambulatory Visit (INDEPENDENT_AMBULATORY_CARE_PROVIDER_SITE_OTHER): Payer: BLUE CROSS/BLUE SHIELD | Admitting: Internal Medicine

## 2018-01-30 ENCOUNTER — Encounter: Payer: Self-pay | Admitting: Internal Medicine

## 2018-01-30 VITALS — BP 100/80 | HR 66 | Ht 69.0 in | Wt 160.0 lb

## 2018-01-30 DIAGNOSIS — Z0000 Encounter for general adult medical examination without abnormal findings: Secondary | ICD-10-CM | POA: Diagnosis not present

## 2018-01-30 LAB — POCT URINALYSIS DIPSTICK
Appearance: NORMAL
BILIRUBIN UA: NEGATIVE
Blood, UA: NEGATIVE
GLUCOSE UA: NEGATIVE
KETONES UA: NEGATIVE
Leukocytes, UA: NEGATIVE
NITRITE UA: 1
ODOR: NORMAL
PROTEIN UA: NEGATIVE
SPEC GRAV UA: 1.015 (ref 1.010–1.025)
Urobilinogen, UA: 0.2 E.U./dL
pH, UA: 7 (ref 5.0–8.0)

## 2018-01-30 NOTE — Patient Instructions (Signed)
It was a pleasure to see you today.  Return in 1 to 2 years or as needed.  Lab work reviewed and is within normal limits.

## 2018-01-30 NOTE — Progress Notes (Signed)
   Subjective:    Patient ID: Jose Phillips, male    DOB: 08/05/1986, 32 y.o.   MRN: [id number]  HPI 32 year old Black Male in today for health maintenance exam.  He is currently working at [employer].  Says he feels well and has no complaints.  He has been a patient here since birth in [birth year].  [facility] saw him as an infant at [facility].  He was 6 pound 13 ounce product of a normal pregnancy.  He had bilateral inguinal hernia repair October 1989.  [facility] of [facility]osis [facility].  History of hypopigmented patches on his body.  [facility] showed mononuclear cell infiltrate.  Had chickenpox in 1990.  He had an [facility] age 32 [facility].  He had a large oval fossa ASD.  Had tetanus immunization in 2012.  In 2009 he was treated for both GC and chlamydia.  No chronic medications.  No known drug allergies.  Social history: He is single.  He graduated from  [employer] with a concentration in business.  He went to graduate school for short period of time thinking he might wanted to get a teaching certificate but he decided to not pursue that and is now working at [employer] full-time.  Says he is happy.  Does not consume alcohol.  Does not smoke.  Family history: Father with history of hypertension and GIST tumor as well as chronic kidney disease. Chronic kidney disease in Father's family.  Mother with history of migraine headaches and lumbar spinal stenosis.        Review of Systems no new complaints     Objective:   Physical Exam Multiple tattoos on arms and neck.  Skin warm and dry.  Muscular.  TMs and pharynx are clear.  Neck is supple.  No thyromegaly.  Chest clear to auscultation.  Cardiac exam regular rate and rhythm normal S1 and S2.  Abdomen scaphoid.  No hepatosplenomegaly masses or tenderness.  No hernias to direct palpation.  Back is straight.  Hips are even.  Neuro: No focal deficits.         Assessment & Plan:  Normal health maintenance exam.  He had fasting labs in April 2019 which are within normal limits.  He takes good care of himself and works out and runs regularly.  Plan: Return in 1 to 2 years or as needed.  Counseled with regard to wearing seatbelts and avoidance of use of illicit drugs as well as safe sex.

## 2018-04-30 ENCOUNTER — Other Ambulatory Visit: Payer: Self-pay

## 2018-04-30 ENCOUNTER — Encounter (HOSPITAL_COMMUNITY): Payer: Self-pay | Admitting: Emergency Medicine

## 2018-04-30 ENCOUNTER — Emergency Department (HOSPITAL_COMMUNITY)
Admission: EM | Admit: 2018-04-30 | Discharge: 2018-04-30 | Disposition: A | Discharge status: Home / Self Care | Payer: BLUE CROSS/BLUE SHIELD | Attending: Emergency Medicine | Admitting: Emergency Medicine

## 2018-04-30 DIAGNOSIS — R110 Nausea: Secondary | ICD-10-CM | POA: Diagnosis present

## 2018-04-30 DIAGNOSIS — F17210 Nicotine dependence, cigarettes, uncomplicated: Secondary | ICD-10-CM | POA: Diagnosis not present

## 2018-04-30 DIAGNOSIS — F1290 Cannabis use, unspecified, uncomplicated: Secondary | ICD-10-CM | POA: Diagnosis not present

## 2018-04-30 DIAGNOSIS — R112 Nausea with vomiting, unspecified: Secondary | ICD-10-CM | POA: Diagnosis not present

## 2018-04-30 LAB — CBC WITH DIFFERENTIAL/PLATELET
BASOS PCT: 0 %
Basophils Absolute: 0 10*3/uL (ref 0.0–0.1)
Eosinophils Absolute: 0.1 10*3/uL (ref 0.0–0.7)
Eosinophils Relative: 1 %
HCT: 42 % (ref 39.0–52.0)
HEMOGLOBIN: 14 g/dL (ref 13.0–17.0)
LYMPHS ABS: 1.1 10*3/uL (ref 0.7–4.0)
LYMPHS PCT: 13 %
MCH: 27.5 pg (ref 26.0–34.0)
MCHC: 33.3 g/dL (ref 30.0–36.0)
MCV: 82.5 fL (ref 78.0–100.0)
MONO ABS: 0.5 10*3/uL (ref 0.1–1.0)
MONOS PCT: 7 %
NEUTROS ABS: 6.4 10*3/uL (ref 1.7–7.7)
NEUTROS PCT: 79 %
Platelets: 226 10*3/uL (ref 150–400)
RBC: 5.09 MIL/uL (ref 4.22–5.81)
RDW: 13.1 % (ref 11.5–15.5)
WBC: 8 10*3/uL (ref 4.0–10.5)

## 2018-04-30 LAB — COMPREHENSIVE METABOLIC PANEL
ALK PHOS: 57 U/L (ref 38–126)
ALT: 23 U/L (ref 0–44)
ANION GAP: 6 (ref 5–15)
AST: 34 U/L (ref 15–41)
Albumin: 4.2 g/dL (ref 3.5–5.0)
BUN: 23 mg/dL — ABNORMAL HIGH (ref 6–20)
CALCIUM: 9.6 mg/dL (ref 8.9–10.3)
CO2: 27 mmol/L (ref 22–32)
Chloride: 108 mmol/L (ref 98–111)
Creatinine, Ser: 1.14 mg/dL (ref 0.61–1.24)
GFR calc Af Amer: >60 mL/min (ref >60)
Glucose, Bld: 109 mg/dL — ABNORMAL HIGH (ref 70–99)
POTASSIUM: 3.9 mmol/L (ref 3.5–5.1)
Sodium: 141 mmol/L (ref 135–145)
TOTAL PROTEIN: 6.8 g/dL (ref 6.5–8.1)
Total Bilirubin: 0.7 mg/dL (ref 0.3–1.2)

## 2018-04-30 LAB — URINALYSIS, ROUTINE W REFLEX MICROSCOPIC
BILIRUBIN URINE: NEGATIVE
GLUCOSE, UA: NEGATIVE mg/dL
Hgb urine dipstick: NEGATIVE
Ketones, ur: 5 mg/dL — AB
LEUKOCYTES UA: NEGATIVE
Nitrite: NEGATIVE
PH: 5 (ref 5.0–8.0)
Protein, ur: 30 mg/dL — AB
SPECIFIC GRAVITY, URINE: 1.033 — AB (ref 1.005–1.030)

## 2018-04-30 MED ORDER — ONDANSETRON HCL 4 MG PO TABS: 4.0000 mg | ORAL_TABLET | Freq: Two times a day (BID) | ORAL | 0 refills | Status: AC

## 2018-04-30 MED ORDER — ONDANSETRON 4 MG PO TBDP
4.0000 mg | ORAL_TABLET | Freq: Once | ORAL | Status: AC
  Administered 2018-04-30: 4 mg via ORAL
  Filled 2018-04-30: qty 1

## 2018-04-30 NOTE — Discharge Instructions (Signed)
I have prescribed Zofran for nausea.I have provided a gallbladder eating plan.There are no abnormalities with your gallbladder but this sets a good example on foods you can eat and some you can avoid. Please return to the ED if you experience a fever, chest pain, or shortness of breath.

## 2018-04-30 NOTE — ED Notes (Signed)
Pt made aware of need for urinal

## 2018-04-30 NOTE — ED Notes (Signed)
Pt given ginger ale.

## 2018-04-30 NOTE — ED Triage Notes (Signed)
Patient states he ate a digiorno pizza from [employer] and 30 minutes to an hour later states he "started feeling weird" and vomited. Patient states he has not thrown up since but "doesn't feel right" and wanted to get checked out

## 2018-04-30 NOTE — ED Provider Notes (Signed)
[facility]-EMERGENCY DEPT Provider Note   CSN: [id number] Arrival date & time: 04/30/18  4098     History   Chief Complaint Chief Complaint  Patient presents with  . Nausea    HPI Jose Phillips is a 32 y.o. male.  32 y/o male with no PMH presents to the ED with a chief complaint of nausea which began this morning after he had Digornio pizza.Patient reports minutes after eating the pizza he began to feel nauseated and had two episode so emesis. He states he felt chills at the time of the incident. He has not tried any therapy for relieve, but states vomiting made him feel better. He denies any abdominal pain,diarrhea, fever, chest pain or shortness of breath.      History reviewed. No pertinent past medical history.  Patient Active Problem List   Diagnosis Date Noted  . History of migraine headaches 10/24/2013  . History of percutaneous transcatheter closure of congenital ASD 10/08/2012    Past Surgical History:  Procedure Laterality Date  . ASD REPAIR    . HERNIA REPAIR          Home Medications    Prior to Admission medications   Medication Sig Start Date End Date Taking? Authorizing Provider  ondansetron (ZOFRAN) 4 MG tablet Take 1 tablet (4 mg total) by mouth 2 (two) times daily for 7 days. 04/30/18 05/07/18  Claude Manges, PA-C    Family History Family History  Problem Relation Age of Onset  . Hypertension Father     Social History Social History   Tobacco Use  . Smoking status: Light Tobacco Smoker  . Smokeless tobacco: Never Used  . Tobacco comment: "WEED" COUPLE OF TIMES A MONTH   Substance Use Topics  . Alcohol use: No  . Drug use: Yes    Types: Marijuana     Allergies   Patient has no known allergies.   Review of Systems Review of Systems  Constitutional: Negative for fatigue.  HENT: Negative for sore throat.   Respiratory: Negative for shortness of breath.   Cardiovascular: Negative for chest pain.    Gastrointestinal: Positive for nausea and vomiting. Negative for blood in stool.  Genitourinary: Negative for dysuria and flank pain.  Musculoskeletal: Negative for back pain and myalgias.  All other systems reviewed and are negative.    Physical Exam Updated Vital Signs BP (!) 106/59 (BP Location: Right Arm)   Pulse 62   Temp 99 F (37.2 C) (Oral)   Resp 16   Ht 5\' 9"  (1.753 m)   Wt 73.5 kg   SpO2 98%   BMI 23.92 kg/m   Physical Exam  Constitutional: He is oriented to person, place, and time. He appears well-developed and well-nourished.  Non-ill appearing   HENT:  Head: Normocephalic and atraumatic.  Neck: Normal range of motion. Neck supple.  Cardiovascular: Normal heart sounds.  Pulmonary/Chest: Breath sounds normal. He has no wheezes.  Abdominal: Soft. Bowel sounds are normal. There is no tenderness. There is no rebound and no guarding.  Abdomen looks normal, no tenderness to palpation.No pain at Mcburneys point or Murphy's sign present.   Musculoskeletal: He exhibits no tenderness or deformity.  Neurological: He is alert and oriented to person, place, and time.  Skin: Skin is warm and dry.  Nursing note and vitals reviewed.    ED Treatments / Results  Labs (all labs ordered are listed, but only abnormal results are displayed) Labs Reviewed  COMPREHENSIVE METABOLIC PANEL -  Abnormal; Notable for the following components:      Result Value   Glucose, Bld 109 (*)    BUN 23 (*)    All other components within normal limits  CBC WITH DIFFERENTIAL/PLATELET  URINALYSIS, ROUTINE W REFLEX MICROSCOPIC    EKG None  Radiology No results found.  Procedures Procedures (including critical care time)  Medications Ordered in ED Medications  ondansetron (ZOFRAN-ODT) disintegrating tablet 4 mg (4 mg Oral Given 04/30/18 0816)     Initial Impression / Assessment and Plan / ED Course  I have reviewed the triage vital signs and the nursing notes.  Pertinent labs &  imaging results that were available during my care of the patient were reviewed by me and considered in my medical decision making (see chart for details).  Presents with emesis after eating Digiorno pizza at home.  Patient denies any abdominal pain but reports some nausea at this time.  He denies any diarrhea.  On examination patient's abdominal exam is unremarkable and there is no tenderness at McBurney's point, I believe this is less likely to be an appendicitis as patient is afebrile and his CBC shows no leukocytosis.  Patient denies any pain in the right upper quadrant region he is not febrile at this time and states his pain got worse after vomiting I believe this is likely to be a gallbladder pathology.  LFTs are within normal limits.  CBC showed no leukocytosis, hemoglobin within normal limits.  CMP showed no electrolyte abnormality, LFTs are within normal range.  Patient is asymptomatic at this time reports his nausea has gotten better after some Zofran.  Will p.o. challenge patient and if tolerated will discharge home with symptomatic treatment.  Urinalysis pending, patient denies any urinary symptoms at this time.   10:06 AM challenge for patient patient was able to tolerate ginger ale along with some crackers, he is asymptomatic at this time and states he would like to be discharged home as his nausea and vomiting have subsided.  She is afebrile and requesting a work note we will provide this for patient.  Return precautions have been discussed at length.  I will send patient home with some Zofran to control his nausea.  Final Clinical Impressions(s) / ED Diagnoses   Final diagnoses:  Nausea    ED Discharge Orders         Ordered    ondansetron (ZOFRAN) 4 MG tablet  2 times daily     04/30/18 1007           Claude Manges, PA-C 04/30/18 1016    Derwood Kaplan, MD 05/01/18 1644

## 2018-07-13 ENCOUNTER — Other Ambulatory Visit: Payer: BLUE CROSS/BLUE SHIELD | Admitting: Internal Medicine

## 2018-07-13 ENCOUNTER — Encounter: Payer: Self-pay | Admitting: Internal Medicine

## 2018-07-13 DIAGNOSIS — Z113 Encounter for screening for infections with a predominantly sexual mode of transmission: Secondary | ICD-10-CM

## 2018-07-14 LAB — HEPATITIS C ANTIBODY
Hepatitis C Ab: NONREACTIVE
SIGNAL TO CUT-OFF: 0.04 (ref <1.00)

## 2018-07-14 LAB — C. TRACHOMATIS/N. GONORRHOEAE RNA
C. TRACHOMATIS RNA, TMA: NOT DETECTED
N. gonorrhoeae RNA, TMA: NOT DETECTED

## 2018-07-14 LAB — HIV ANTIBODY (ROUTINE TESTING W REFLEX): HIV 1&2 Ab, 4th Generation: NONREACTIVE

## 2018-07-20 ENCOUNTER — Ambulatory Visit: Payer: BLUE CROSS/BLUE SHIELD | Admitting: Internal Medicine

## 2018-09-28 ENCOUNTER — Ambulatory Visit (INDEPENDENT_AMBULATORY_CARE_PROVIDER_SITE_OTHER): Payer: BLUE CROSS/BLUE SHIELD | Admitting: Internal Medicine

## 2018-09-28 ENCOUNTER — Encounter: Payer: Self-pay | Admitting: Internal Medicine

## 2018-09-28 VITALS — BP 100/70 | HR 82 | Temp 98.6°F | Ht 69.0 in | Wt 157.0 lb

## 2018-09-28 DIAGNOSIS — J22 Unspecified acute lower respiratory infection: Secondary | ICD-10-CM

## 2018-09-28 MED ORDER — DOXYCYCLINE HYCLATE 100 MG PO TABS: 100.0000 mg | ORAL_TABLET | Freq: Two times a day (BID) | ORAL | 0 refills | Status: DC

## 2018-09-28 MED ORDER — HYDROCODONE-HOMATROPINE 5-1.5 MG/5ML PO SYRP: 5.0000 mL | ORAL_SOLUTION | Freq: Three times a day (TID) | ORAL | 0 refills | Status: DC | PRN

## 2018-09-28 NOTE — Patient Instructions (Addendum)
Doxycycline 100 mg twice daily for 10 days.  Hycodan 1 teaspoon p.o. every 8 hours as needed cough.  Rest and drink plenty of fluids.  Out of work today and tomorrow and note provided.

## 2018-09-28 NOTE — Progress Notes (Signed)
   Subjective:    Patient ID: Jose Phillips, male    DOB: 05-25-1986, 33 y.o.   MRN: [id number]  HPI For about a week he has had a cough.  Has had malaise and fatigue.  Is tried taking over-the-counter medication without any relief.  He is working 2 jobs.  Denies fever or shaking chills.  No myalgias.  Says his mother convinced him to come to the doctor today for treatment.    Review of Systems see above-cough is productive     Objective:   Physical Exam Temperature 98.6 degrees blood pressure 100/70 pulse 82.  Pulse oximetry 98%. Skin warm and dry.  Nodes none.  Pharynx is slightly injected without exudate.  TMs are full bilaterally and slightly pink.  Neck is supple without adenopathy.  Chest is clear to auscultation without rales has occasional inspiratory wheeze in the lower lobes however he has a congested cough     Assessment & Plan:  Acute lower respiratory infection  Plan: Doxycycline 100 mg twice daily for 10 days.  Hycodan 1 teaspoon p.o. every 8 hours as needed cough.  Note to be out of work today and tomorrow.  Rest and drink plenty of fluids.

## 2018-09-29 ENCOUNTER — Telehealth: Payer: Self-pay | Admitting: Internal Medicine

## 2018-09-29 NOTE — Telephone Encounter (Signed)
Pt was in here yesterday and says the medicine that was prescribed is making him feel really weird. He said when he got up this morning he was light headed and he also has been having to go to the bathroom more to defecate. Pt would like a call back on  [phone] to know if he should keep taking it or possibly take something else or if Dr. Lenord Fellers needed to write him out of work an extra day.

## 2018-09-29 NOTE — Telephone Encounter (Signed)
Change Doxycycline to Zithromax Z-pak please and call him back

## 2018-09-29 NOTE — Telephone Encounter (Signed)
He said he hasn't started on the doxycyline it is the [facility] that made him sick, I advised patient not to take [facility] and to start doxycycline tomorrow. Patient to call us back tomorrow if he feels he needs another day off of work.

## 2018-09-29 NOTE — Addendum Note (Signed)
Addended by: Gregery Na on: 09/29/2018 03:06 PM   Modules accepted: Orders

## 2018-09-30 ENCOUNTER — Other Ambulatory Visit: Payer: Self-pay | Admitting: Internal Medicine

## 2019-03-23 ENCOUNTER — Ambulatory Visit: Payer: BC Managed Care – PPO | Admitting: Internal Medicine

## 2019-03-25 ENCOUNTER — Other Ambulatory Visit: Payer: Self-pay

## 2019-03-25 ENCOUNTER — Ambulatory Visit: Payer: BC Managed Care – PPO | Admitting: Internal Medicine

## 2019-03-25 ENCOUNTER — Encounter: Payer: Self-pay | Admitting: Internal Medicine

## 2019-03-25 VITALS — BP 100/60 | HR 66 | Temp 98.0°F | Ht 69.0 in | Wt 162.0 lb

## 2019-03-25 DIAGNOSIS — S30825A Blister (nonthermal) of unspecified external genital organs, male, initial encounter: Secondary | ICD-10-CM | POA: Diagnosis not present

## 2019-03-25 MED ORDER — NYSTATIN-TRIAMCINOLONE 100000-0.1 UNIT/GM-% EX CREA: TOPICAL_CREAM | CUTANEOUS | 2 refills | Status: DC

## 2019-03-25 MED ORDER — MUPIROCIN 2 % EX OINT: 1.0000 "application " | TOPICAL_OINTMENT | Freq: Two times a day (BID) | CUTANEOUS | 0 refills | Status: DC

## 2019-03-25 NOTE — Progress Notes (Signed)
   Subjective:    Patient ID: Jose Phillips, male    DOB: 1985-10-16, 33 y.o.   MRN: [id number]  HPI 33 year old Male with lesion on penis noted about 4 days ago. Thought it was a pimple and opened the lesion. Expressed some yellow fkuid and slight amount of blood. No hx of active HSV. Did receive oral sex over the weekend with male he sees from time to time. Used condom for intercourse. Has another male partner also. Also has rash in groin     Review of Systems see above- no prior hx of Herpes simplex.     Objective:   Physical Exam Apprx. 2-3 mm circular round shallow ulceration without drainage mid penis.  Rash in right groin c/w tinea cruris with hyperpigmentation.     Assessment & Plan:  Herpes simplex  Tinea cruris  Plan: Apply Bactroban ointment to penile ulcer twice a day.  Mycolog cream to tinea cruris rash in groin twice daily as well.  Culture is pending for Herpes simplex.  Patient is very anxious to have a diagnosis.  Indicated to him it would be a few days getting results of this culture.

## 2019-03-25 NOTE — Patient Instructions (Signed)
Herpes culture obtained.  Apply Bactroban ointment to penile lesion twice daily.  Apply Mycolog cream to groin area for tinea cruris twice daily.

## 2019-03-29 LAB — HERPES CULTURE, RAPID
MICRO NUMBER:: 793248
SPECIMEN QUALITY:: ADEQUATE

## 2020-07-03 ENCOUNTER — Other Ambulatory Visit: Payer: BC Managed Care – PPO | Admitting: Internal Medicine

## 2020-07-03 ENCOUNTER — Other Ambulatory Visit: Payer: Self-pay

## 2020-07-03 DIAGNOSIS — Z0000 Encounter for general adult medical examination without abnormal findings: Secondary | ICD-10-CM

## 2020-07-03 DIAGNOSIS — Z13220 Encounter for screening for lipoid disorders: Secondary | ICD-10-CM

## 2020-07-03 LAB — CBC WITH DIFFERENTIAL/PLATELET
Absolute Monocytes: 240 cells/uL (ref 200–950)
Basophils Absolute: 39 cells/uL (ref 0–200)
Basophils Relative: 0.8 %
Eosinophils Absolute: 279 cells/uL (ref 15–500)
Eosinophils Relative: 5.7 %
HCT: 46.8 % (ref 38.5–50.0)
Hemoglobin: 15.7 g/dL (ref 13.2–17.1)
Lymphs Abs: 1749 cells/uL (ref 850–3900)
MCH: 28 pg (ref 27.0–33.0)
MCHC: 33.5 g/dL (ref 32.0–36.0)
MCV: 83.6 fL (ref 80.0–100.0)
MPV: 9.8 fL (ref 7.5–12.5)
Monocytes Relative: 4.9 %
Neutro Abs: 2592 cells/uL (ref 1500–7800)
Neutrophils Relative %: 52.9 %
Platelets: 295 10*3/uL (ref 140–400)
RBC: 5.6 10*6/uL (ref 4.20–5.80)
RDW: 12.7 % (ref 11.0–15.0)
Total Lymphocyte: 35.7 %
WBC: 4.9 10*3/uL (ref 3.8–10.8)

## 2020-07-03 LAB — COMPLETE METABOLIC PANEL WITH GFR
AG Ratio: 1.8 (calc) (ref 1.0–2.5)
ALT: 14 U/L (ref 9–46)
AST: 18 U/L (ref 10–40)
Albumin: 4.4 g/dL (ref 3.6–5.1)
Alkaline phosphatase (APISO): 62 U/L (ref 36–130)
BUN: 17 mg/dL (ref 7–25)
CO2: 30 mmol/L (ref 20–32)
Calcium: 9.8 mg/dL (ref 8.6–10.3)
Chloride: 107 mmol/L (ref 98–110)
Creat: 1.16 mg/dL (ref 0.60–1.35)
GFR, Est African American: 95 mL/min/{1.73_m2} (ref >=60)
GFR, Est Non African American: 82 mL/min/{1.73_m2} (ref >=60)
Globulin: 2.4 g/dL (calc) (ref 1.9–3.7)
Glucose, Bld: 91 mg/dL (ref 65–99)
Potassium: 4.7 mmol/L (ref 3.5–5.3)
Sodium: 142 mmol/L (ref 135–146)
Total Bilirubin: 0.5 mg/dL (ref 0.2–1.2)
Total Protein: 6.8 g/dL (ref 6.1–8.1)

## 2020-07-03 LAB — LIPID PANEL
Cholesterol: 148 mg/dL (ref <200)
HDL: 59 mg/dL (ref >=40)
LDL Cholesterol (Calc): 71 mg/dL (calc)
Non-HDL Cholesterol (Calc): 89 mg/dL (calc) (ref <130)
Total CHOL/HDL Ratio: 2.5 (calc) (ref <5.0)
Triglycerides: 93 mg/dL (ref <150)

## 2020-07-10 ENCOUNTER — Ambulatory Visit (INDEPENDENT_AMBULATORY_CARE_PROVIDER_SITE_OTHER): Payer: BC Managed Care – PPO | Admitting: Internal Medicine

## 2020-07-10 ENCOUNTER — Encounter: Payer: Self-pay | Admitting: Internal Medicine

## 2020-07-10 ENCOUNTER — Other Ambulatory Visit: Payer: Self-pay

## 2020-07-10 VITALS — BP 120/80 | HR 82 | Ht 69.0 in | Wt 166.0 lb

## 2020-07-10 DIAGNOSIS — Z113 Encounter for screening for infections with a predominantly sexual mode of transmission: Secondary | ICD-10-CM

## 2020-07-10 DIAGNOSIS — Z0000 Encounter for general adult medical examination without abnormal findings: Secondary | ICD-10-CM

## 2020-07-10 LAB — POCT URINALYSIS DIPSTICK
Appearance: NEGATIVE
Bilirubin, UA: NEGATIVE
Blood, UA: NEGATIVE
Glucose, UA: NEGATIVE
Ketones, UA: NEGATIVE
Leukocytes, UA: NEGATIVE
Nitrite, UA: NEGATIVE
Odor: NEGATIVE
Protein, UA: NEGATIVE
Spec Grav, UA: 1.01 (ref 1.010–1.025)
Urobilinogen, UA: 0.2 E.U./dL
pH, UA: 6.5 (ref 5.0–8.0)

## 2020-07-10 NOTE — Progress Notes (Signed)
   Subjective:    Patient ID: Jose Phillips, male    DOB: 07-13-1986, 34 y.o.   MRN: [id number]  HPI 34 year old Male seen for health maintenance exam and evaluation of medical issues.  He is working at [employer].  He says he feels well and has no complaints.  He has been a patient here since birth in [birth year].  I initially saw him as an infant at [facility].  It was a 6 pound 13 ounce product of a normal pregnancy.  His general health has been excellent.  He had bilateral inguinal hernia repair October 1989.  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  History of hypopigmented patches on his body.  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  He had chickenpox in 1990.  He had an ASD transcatheter closure at age 58 at [facility].  No chronic medications.  No known drug allergies.  History of chickenpox in 83.  Social history: He is single.  He graduated from [employer] with a concentration in business.  He works at [employer].  He works out a great deal and eats correctly.  He is in excellent physical condition.  He does not drink alcohol, smoke or use drugs.  Family history: Father with history of hypertension.  Mother with history of lumbar stenosis.  1 sister with history of narcolepsy.  Patient had tetanus update given in 2014.  He has had 2 COVID vaccines and encouraged him to get third vaccine as soon as possible.  Flu vaccine given today.  He had both gonorrhea and chlamydia in 2009.      Review of Systems no new complaints-feels well     Objective:   Physical Exam Blood pressure 120/80 pulse 82 regular pulse oximetry 98% weight 166 pounds BMI 24.51 height 5 feet 9 inches  Skin: Warm and dry.  Multiple tasteful tattoos.  Nodes none.  Neck is supple without JVD thyromegaly or carotid bruits.  No adenopathy.  Chest is clear to auscultation.  Cardiac exam: Regular rate and rhythm normal S1 and S2 without murmurs or gallops.  Abdomen is  soft nondistended without hepatosplenomegaly masses or tenderness.  No penile discharge.  Extremities without deformity.  Neuro intact without focal deficits.  Affect thought and judgment are normal.  Patient requested STD testing for chlamydia and gonorrhea and these are negative.  Urine dipstick is normal.  CBC and c-Met are normal.  Lipid panel is normal.       Assessment & Plan:  Normal health maintenance exam  Screening for STDs  Plan: He is in excellent physical health.  STD testing as requested is negative.  HIV testing not done.  Return in 1 to 2 years or as needed.

## 2020-07-13 ENCOUNTER — Ambulatory Visit (INDEPENDENT_AMBULATORY_CARE_PROVIDER_SITE_OTHER): Payer: BC Managed Care – PPO | Admitting: Internal Medicine

## 2020-07-13 ENCOUNTER — Other Ambulatory Visit: Payer: Self-pay

## 2020-07-13 VITALS — BP 110/80 | HR 80 | Temp 97.9°F | Ht 69.0 in | Wt 165.0 lb

## 2020-07-13 DIAGNOSIS — Z23 Encounter for immunization: Secondary | ICD-10-CM | POA: Diagnosis not present

## 2020-07-13 NOTE — Progress Notes (Signed)
Flu Vaccine per CMA 

## 2020-07-13 NOTE — Patient Instructions (Signed)
Patient received a flu vaccine IM L deltoid, AV, CMA  

## 2020-07-14 LAB — C. TRACHOMATIS/N. GONORRHOEAE RNA
C. trachomatis RNA, TMA: NOT DETECTED
N. gonorrhoeae RNA, TMA: NOT DETECTED

## 2020-08-23 NOTE — Patient Instructions (Signed)
It is a pleasure to see you today.  Lab work is completely normal.  STD testing is negative.  Return in 1 to 2 years or as needed.

## 2020-12-23 ENCOUNTER — Emergency Department (HOSPITAL_COMMUNITY)
Admission: EM | Admit: 2020-12-23 | Discharge: 2020-12-23 | Disposition: A | Discharge status: Home / Self Care | Payer: BC Managed Care – PPO | Attending: Emergency Medicine | Admitting: Emergency Medicine

## 2020-12-23 DIAGNOSIS — Z711 Person with feared health complaint in whom no diagnosis is made: Secondary | ICD-10-CM | POA: Diagnosis not present

## 2020-12-23 DIAGNOSIS — F17200 Nicotine dependence, unspecified, uncomplicated: Secondary | ICD-10-CM | POA: Diagnosis not present

## 2020-12-23 NOTE — ED Provider Notes (Signed)
[facility]-EMERGENCY DEPT Provider Note   CSN: [id number] Arrival date & time: 12/23/20  1527     History Chief Complaint  Patient presents with  . arm size    Jose Phillips is a 35 y.o. male with no pertinent past medical history that presents to the emergency department for evaluation of his right bicep.  Patient states that he thinks his right bicep is bigger than his left bicep and wants this to be evaluated.  Patient states that he has been lifting a lot recently, does a lot of lifting at work, works at [employer].  Patient states that he is right-hand dominant and normally does lift primarily with this arm and is concerned that his right bicep is slightly larger than the left.  Denies any pain in this area, numbness or tingling down into his arm.  Denies any swelling, erythema or fevers.  Patient denies taking any steroids or other medications for substitutes for his workouts.  Denies any substance use.  Denies any indwelling catheter, prior DVT, clotting disorder.  No other complaints at this time.  HPI     No past medical history on file.  Patient Active Problem List   Diagnosis Date Noted  . History of migraine headaches 10/24/2013  . History of percutaneous transcatheter closure of congenital ASD 10/08/2012    Past Surgical History:  Procedure Laterality Date  . ASD REPAIR    . HERNIA REPAIR         Family History  Problem Relation Age of Onset  . Hypertension Father     Social History   Tobacco Use  . Smoking status: Light Tobacco Smoker  . Smokeless tobacco: Never Used  . Tobacco comment: "WEED" COUPLE OF TIMES A MONTH   Substance Use Topics  . Alcohol use: No  . Drug use: Yes    Types: Marijuana    Home Medications Prior to Admission medications   Not on File    Allergies    Patient has no known allergies.  Review of Systems   Review of Systems  Constitutional: Negative for diaphoresis, fatigue and fever.  Eyes: Negative  for visual disturbance.  Respiratory: Negative for shortness of breath.   Cardiovascular: Negative for chest pain.  Gastrointestinal: Negative for nausea and vomiting.  Musculoskeletal: Negative for back pain and myalgias.  Skin: Negative for color change, pallor, rash and wound.  Neurological: Negative for syncope, weakness, light-headedness, numbness and headaches.  Psychiatric/Behavioral: Negative for behavioral problems and confusion.    Physical Exam Updated Vital Signs BP 124/71 (BP Location: Left Arm)   Pulse 80   Temp 98.8 F (37.1 C) (Oral)   Resp 16   Ht 5\' 9"  (1.753 m)   Wt 69.6 kg   SpO2 100%   BMI 22.65 kg/m   Physical Exam Constitutional:      General: He is not in acute distress.    Appearance: Normal appearance. He is not ill-appearing, toxic-appearing or diaphoretic.  Cardiovascular:     Rate and Rhythm: Normal rate and regular rhythm.     Pulses: Normal pulses.  Pulmonary:     Effort: Pulmonary effort is normal.     Breath sounds: Normal breath sounds.  Musculoskeletal:        General: Normal range of motion.     Comments: Right bicep and left bicep look symmetrical, no biceps deformity.  Normal range of motion to shoulder, elbows and wrist on upper extremities.  No tenderness to palpation anywhere.  No erythema or warmth.  Normal sensation throughout.  Radial pulse 2+ equal.  Skin:    General: Skin is warm and dry.     Capillary Refill: Capillary refill takes less than 2 seconds.  Neurological:     General: No focal deficit present.     Mental Status: He is alert and oriented to person, place, and time.  Psychiatric:        Mood and Affect: Mood normal.        Behavior: Behavior normal.        Thought Content: Thought content normal.     ED Results / Procedures / Treatments   Labs (all labs ordered are listed, but only abnormal results are displayed) Labs Reviewed - No data to display  EKG None  Radiology No results  found.  Procedures Procedures   Medications Ordered in ED Medications - No data to display  ED Course  I have reviewed the triage vital signs and the nursing notes.  Pertinent labs & imaging results that were available during my care of the patient were reviewed by me and considered in my medical decision making (see chart for details).    MDM Rules/Calculators/A&P                          Jose Phillips is a 35 y.o. male with no pertinent past medical history that presents to the emergency department for evaluation of his right bicep.  Patient wants to be evaluated because his right bicep is slightly bigger than his left bicep, did not really appreciate this on exam, however patient has been working out more on his right side and is right-hand dominant.  Patient with normal exam.  No emergent medical problems found today, patient will follow up with PCP.  Doubt need for further emergent work up at this time. I explained the diagnosis and have given explicit precautions to return to the ER including for any other new or worsening symptoms. The patient understands and accepts the medical plan as it's been dictated and I have answered their questions. Discharge instructions concerning home care and prescriptions have been given. The patient is STABLE and is discharged to home in good condition.   Final Clinical Impression(s) / ED Diagnoses Final diagnoses:  Worried well    Rx / DC Orders ED Discharge Orders    None       Farrel Gordon, PA-C 12/23/20 1612    Arby Barrette, MD 01/09/21 [phone]

## 2020-12-23 NOTE — Discharge Instructions (Signed)
  You were evaluated in the Emergency Department and after careful evaluation, we did not find any emergent condition requiring admission or further testing in the hospital.   Your exam/testing today was overall reassuring.  Please return to the Emergency Department if you experience any worsening of your condition.  Thank you for allowing us to be a part of your care. Please speak to your pharmacist about any new medications prescribed today in regards to side effects or interactions with other medications.     

## 2020-12-23 NOTE — ED Triage Notes (Addendum)
Patient reports his right arm looks bigger than left arm and he wants to make sure nothing is wrong. Patient states he does a lot of physical lifting during the day, and right side is his dominant side. Patient denies pain and denies injury.

## 2020-12-27 ENCOUNTER — Telehealth: Payer: Self-pay | Admitting: Internal Medicine

## 2020-12-27 NOTE — Telephone Encounter (Signed)
LVM to CB to schedule an appointment 

## 2020-12-27 NOTE — Telephone Encounter (Signed)
Jose Phillips [phone]  Julis called to see if he can drop off some urine and have some STD testing done, he stated he usually has all of his testing done here, he feels more comfortably having it done here.

## 2020-12-27 NOTE — Telephone Encounter (Signed)
Make this a nurse visit. He will need to give specimen here so we know it is his.

## 2020-12-28 NOTE — Telephone Encounter (Signed)
scheduled

## 2021-01-05 ENCOUNTER — Encounter: Payer: BC Managed Care – PPO | Admitting: Internal Medicine

## 2021-01-05 ENCOUNTER — Ambulatory Visit: Payer: BC Managed Care – PPO | Admitting: Internal Medicine

## 2021-01-25 ENCOUNTER — Ambulatory Visit (INDEPENDENT_AMBULATORY_CARE_PROVIDER_SITE_OTHER): Payer: BC Managed Care – PPO | Admitting: Internal Medicine

## 2021-01-25 ENCOUNTER — Other Ambulatory Visit: Payer: Self-pay

## 2021-01-25 ENCOUNTER — Encounter: Payer: Self-pay | Admitting: Internal Medicine

## 2021-01-25 VITALS — BP 110/80 | HR 70 | Temp 98.0°F | Ht 69.0 in | Wt 156.0 lb

## 2021-01-25 DIAGNOSIS — Z113 Encounter for screening for infections with a predominantly sexual mode of transmission: Secondary | ICD-10-CM

## 2021-01-25 MED ORDER — MELOXICAM 15 MG PO TABS: 15.0000 mg | ORAL_TABLET | Freq: Every day | ORAL | 0 refills | Status: DC

## 2021-01-25 MED ORDER — MELOXICAM 15 MG PO TABS: 15.0000 mg | ORAL_TABLET | Freq: Every day | ORAL | 1 refills | Status: DC

## 2021-01-25 NOTE — Patient Instructions (Signed)
Urine STD check obtained.  Take Mobic 15 mg daily with a meal for up to 30 days for musculoskeletal pain right shoulder.  If symptoms persist, see orthopedist.  Apply ice to right shoulder for 20 minutes daily when you get home from work.
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
## 2021-01-26 LAB — C. TRACHOMATIS/N. GONORRHOEAE RNA
C. trachomatis RNA, TMA: NOT DETECTED
N. gonorrhoeae RNA, TMA: NOT DETECTED

## 2021-02-22 ENCOUNTER — Telehealth: Payer: Self-pay | Admitting: Internal Medicine

## 2021-02-22 ENCOUNTER — Other Ambulatory Visit: Payer: Self-pay

## 2021-02-22 ENCOUNTER — Other Ambulatory Visit: Payer: BC Managed Care – PPO | Admitting: Internal Medicine

## 2021-02-22 DIAGNOSIS — Z113 Encounter for screening for infections with a predominantly sexual mode of transmission: Secondary | ICD-10-CM

## 2021-02-22 NOTE — Telephone Encounter (Signed)
scheduled

## 2021-02-22 NOTE — Telephone Encounter (Signed)
Hillel Card [phone]  Kendric called wants to know if he can drop off urine to be tested for STD's, he does not want to stay, he just wants to drop urine off, he has to be at work at 12:00. I let him know this is not something we do on a regular basis.

## 2021-02-23 LAB — C. TRACHOMATIS/N. GONORRHOEAE RNA
C. trachomatis RNA, TMA: NOT DETECTED
N. gonorrhoeae RNA, TMA: NOT DETECTED

## 2021-04-13 ENCOUNTER — Encounter (HOSPITAL_COMMUNITY): Payer: Self-pay

## 2021-04-13 ENCOUNTER — Other Ambulatory Visit: Payer: Self-pay

## 2021-04-13 ENCOUNTER — Emergency Department (HOSPITAL_COMMUNITY)
Admission: EM | Admit: 2021-04-13 | Discharge: 2021-04-13 | Disposition: A | Discharge status: Home / Self Care | Payer: BC Managed Care – PPO | Attending: Emergency Medicine | Admitting: Emergency Medicine

## 2021-04-13 DIAGNOSIS — L299 Pruritus, unspecified: Secondary | ICD-10-CM | POA: Insufficient documentation

## 2021-04-13 DIAGNOSIS — K6289 Other specified diseases of anus and rectum: Secondary | ICD-10-CM | POA: Insufficient documentation

## 2021-04-13 DIAGNOSIS — F17200 Nicotine dependence, unspecified, uncomplicated: Secondary | ICD-10-CM | POA: Insufficient documentation

## 2021-04-13 DIAGNOSIS — R208 Other disturbances of skin sensation: Secondary | ICD-10-CM | POA: Insufficient documentation

## 2021-04-13 MED ORDER — BACITRACIN-NEOMYCIN-POLYMYXIN 400-5-5000 EX OINT: 1.0000 "application " | TOPICAL_OINTMENT | Freq: Two times a day (BID) | CUTANEOUS | 0 refills | Status: DC

## 2021-04-13 MED ORDER — NYSTATIN 100000 UNIT/GM EX POWD: 1.0000 "application " | Freq: Three times a day (TID) | CUTANEOUS | 0 refills | Status: DC

## 2021-04-13 NOTE — ED Triage Notes (Signed)
Pt request rectal examination (from male preferable). Feels pain, and stinging when wet. Wants to make sure everything's ok.

## 2021-04-13 NOTE — ED Provider Notes (Signed)
[facility]-EMERGENCY DEPT Provider Note   CSN: [id number] Arrival date & time: 04/13/21  0345     History No chief complaint on file.   Johndaniel Catlin Dejarnette is a 35 y.o. male.  Patient is a 35 yo male presenting for rectal pain. Pt admits to hx of jock itch dx by pcp and sent home on anti-fungal medications over the past 1-3 weeks. States he was itching frequently and thinks he caused a wound near the rectum. Patient admits to burning sensation and pain when having bowel movements. Denies hx of hemorrhoids or rectal fissures. Denies hx of constipation.   The history is provided by the patient. No language interpreter was used.      History reviewed. No pertinent past medical history.  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 Authorizing Provider  meloxicam (MOBIC) 15 MG tablet Take 1 tablet (15 mg total) by mouth daily. 01/25/21   Margaree Mackintosh, MD    Allergies    Patient has no known allergies.  Review of Systems   Review of Systems  Constitutional:  Negative for chills and fever.  HENT:  Negative for ear pain and sore throat.   Eyes:  Negative for pain and visual disturbance.  Respiratory:  Negative for cough and shortness of breath.   Cardiovascular:  Negative for chest pain and palpitations.  Gastrointestinal:  Negative for  abdominal pain and vomiting.  Genitourinary:  Negative for dysuria and hematuria.       Rectal wound/pain  Musculoskeletal:  Negative for arthralgias and back pain.  Skin:  Negative for color change and rash.  Neurological:  Negative for seizures and syncope.  All other systems reviewed and are negative.  Physical Exam Updated Vital Signs BP 138/77 (BP Location: Left Arm)   Pulse 77   Temp 98.3 F (36.8 C) (Oral)   Resp 17   Ht 5\' 9"  (1.753 m)   Wt 74.8 kg   SpO2 97%   BMI 24.37 kg/m   Physical Exam Vitals and nursing note reviewed.  Constitutional:      Appearance: He is well-developed.  HENT:     Head: Normocephalic and atraumatic.  Eyes:     Conjunctiva/sclera: Conjunctivae normal.  Cardiovascular:     Rate and Rhythm: Normal rate and regular rhythm.     Heart sounds: No murmur heard. Pulmonary:     Effort: Pulmonary effort is normal. No respiratory distress.     Breath sounds: Normal breath sounds.  Abdominal:     Palpations: Abdomen is soft.     Tenderness: There is no abdominal tenderness.  Genitourinary:    Rectum: No anal fissure or internal hemorrhoid.       Comments: Normal penis and  scrotum. No rashes.  Musculoskeletal:     Cervical back: Neck supple.  Skin:    General: Skin is warm and dry.  Neurological:     Mental Status: He is alert.    ED Results / Procedures / Treatments   Labs (all labs ordered are listed, but only abnormal results are displayed) Labs Reviewed - No data to display  EKG None  Radiology No results found.  Procedures Procedures   Medications Ordered in ED Medications - No data to display  ED Course  I have reviewed the triage vital signs and the nursing notes.  Pertinent labs & imaging results that were available during my care of the patient were reviewed by me and considered in my medical decision making (see chart for details).    MDM Rules/Calculators/A&P                          8:00 AM  35 yo male  presenting for rectal pain after scratching. Pt is Aox3, no acute distress, afebrile, with stable vitals. Physical exam demonstrates 0.5 x 0.5 cm skin tear and 2 o'clock position adjacent to rectum. No warm, swelling, fluctuance, or bleeding.   Patient recommended for neosporin and/or A&D ointment application prior to bowel movements to prevent further irrigation and to keep the area clean and dry to prevent infection. 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 No other lesions/ulcers visualized. HSV outbreak less likely. Viral culture sent and pt given instructions that we will call back in th next 1-2 days for any positive results and sent acyclovir to pharmacy if positive. Likely not however.   Patient in no distress and overall condition improved here in the ED. Detailed discussions were had with the patient regarding current findings, and need for close f/u with PCP or on call doctor. The patient has been instructed to return immediately if the symptoms worsen in any way for re-evaluation. Patient verbalized understanding and is in agreement with current care plan. All questions answered prior to discharge.  Notified by nursing personal after patient left, wrong viral swab collected. G/C swab sent instead of HSV viral swab. Patient called and notified. He agrees to return if more than one lesion develops for testing and/or just treatment of HSV.      Final Clinical Impression(s) / ED Diagnoses Final diagnoses:  Skin Tear-Rectal or anal pain    Rx / DC Orders ED Discharge Orders     None        Franne Forts, DO 04/14/21 0900

## 2021-04-13 NOTE — Discharge Instructions (Addendum)
Rectal pain is likely from skin tear secondary to itching, however, you were tested today to rule out STD. I will call you in the next 1-2 days for any positive results and call over medications for you. However unlikely, use neosporin on area to prevent further itching and pain during bowel movements.

## 2021-06-02 ENCOUNTER — Emergency Department (HOSPITAL_COMMUNITY)
Admission: EM | Admit: 2021-06-02 | Discharge: 2021-06-02 | Disposition: A | Discharge status: Home / Self Care | Payer: BC Managed Care – PPO | Attending: Emergency Medicine | Admitting: Emergency Medicine

## 2021-06-02 ENCOUNTER — Other Ambulatory Visit: Payer: Self-pay

## 2021-06-02 ENCOUNTER — Encounter (HOSPITAL_COMMUNITY): Payer: Self-pay

## 2021-06-02 ENCOUNTER — Emergency Department (HOSPITAL_COMMUNITY): Payer: BC Managed Care – PPO

## 2021-06-02 DIAGNOSIS — H5371 Glare sensitivity: Secondary | ICD-10-CM | POA: Insufficient documentation

## 2021-06-02 DIAGNOSIS — R519 Headache, unspecified: Secondary | ICD-10-CM

## 2021-06-02 DIAGNOSIS — Z79899 Other long term (current) drug therapy: Secondary | ICD-10-CM | POA: Diagnosis not present

## 2021-06-02 DIAGNOSIS — Z8774 Personal history of (corrected) congenital malformations of heart and circulatory system: Secondary | ICD-10-CM | POA: Diagnosis not present

## 2021-06-02 LAB — BASIC METABOLIC PANEL
Anion gap: 6 (ref 5–15)
BUN: 21 mg/dL — ABNORMAL HIGH (ref 6–20)
CO2: 23 mmol/L (ref 22–32)
Calcium: 9.3 mg/dL (ref 8.9–10.3)
Chloride: 107 mmol/L (ref 98–111)
Creatinine, Ser: 1.1 mg/dL (ref 0.61–1.24)
GFR, Estimated: >60 mL/min (ref >60)
Glucose, Bld: 85 mg/dL (ref 70–99)
Potassium: 4.1 mmol/L (ref 3.5–5.1)
Sodium: 136 mmol/L (ref 135–145)

## 2021-06-02 LAB — CBC WITH DIFFERENTIAL/PLATELET
Abs Immature Granulocytes: 0.02 10*3/uL (ref 0.00–0.07)
Basophils Absolute: 0.1 10*3/uL (ref 0.0–0.1)
Basophils Relative: 1 %
Eosinophils Absolute: 0.1 10*3/uL (ref 0.0–0.5)
Eosinophils Relative: 2 %
HCT: 46.9 % (ref 39.0–52.0)
Hemoglobin: 15.4 g/dL (ref 13.0–17.0)
Immature Granulocytes: 0 %
Lymphocytes Relative: 21 %
Lymphs Abs: 1.1 10*3/uL (ref 0.7–4.0)
MCH: 27.7 pg (ref 26.0–34.0)
MCHC: 32.8 g/dL (ref 30.0–36.0)
MCV: 84.5 fL (ref 80.0–100.0)
Monocytes Absolute: 0.3 10*3/uL (ref 0.1–1.0)
Monocytes Relative: 5 %
Neutro Abs: 3.7 10*3/uL (ref 1.7–7.7)
Neutrophils Relative %: 71 %
Platelets: 274 10*3/uL (ref 150–400)
RBC: 5.55 MIL/uL (ref 4.22–5.81)
RDW: 13.6 % (ref 11.5–15.5)
WBC: 5.1 10*3/uL (ref 4.0–10.5)
nRBC: 0 % (ref 0.0–0.2)

## 2021-06-02 MED ORDER — KETOROLAC TROMETHAMINE 30 MG/ML IJ SOLN
15.0000 mg | Freq: Once | INTRAMUSCULAR | Status: AC
  Administered 2021-06-02: 15 mg via INTRAMUSCULAR
  Filled 2021-06-02: qty 1

## 2021-06-02 NOTE — ED Provider Notes (Signed)
[facility]-EMERGENCY DEPT Provider Note   CSN: [id number] Arrival date & time: 06/02/21  1252     History Chief Complaint  Patient presents with   Headache    Jose Phillips is a 35 y.o. male.  HPI Patient presents with concern of headache.  He is history of headaches, neck some time, has had them intermittently, but has developed concerned about the persistency.  Most recently has been having them nearly daily.  There is associated photophobia, but no vision loss, no extremity weakness, no vomiting.  Inconsistent relief with OTC medication.  He has not seen a physician for these.    History reviewed. No pertinent past medical history.  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 Authorizing Provider  meloxicam (MOBIC) 15 MG tablet Take 1 tablet (15 mg total) by mouth daily. 01/25/21   Margaree Mackintosh, MD  neomycin-bacitracin-polymyxin (NEOSPORIN) ointment Apply 1 application topically every 12 (twelve) hours. 04/13/21   Edwin Dada P, DO  nystatin (MYCOSTATIN/NYSTOP) powder Apply 1 application topically 3 (three) times daily. 04/13/21   Franne Forts, DO    Allergies    Patient has no known allergies.  Review of Systems   Review of Systems  Eyes:  Positive for photophobia. Negative for visual  disturbance.  Neurological:  Positive for headaches.   Physical Exam Updated Vital Signs BP (!) 143/58 (BP Location: Right Arm)   Pulse 78   Temp 98 F (36.7 C) (Oral)   Resp 16   Ht 5\' 9"  (1.753 m)   Wt 73.8 kg   SpO2 99%   BMI 24.03 kg/m   Physical Exam Vitals and nursing note reviewed.  Constitutional:      General: He is not in acute distress.    Appearance: He is well-developed.  HENT:     Head: Normocephalic and atraumatic.  Eyes:     Conjunctiva/sclera: Conjunctivae normal.  Pulmonary:     Effort: Pulmonary effort is normal. No respiratory distress.     Breath sounds: No stridor.  Abdominal:     General: There is no distension.  Skin:    General: Skin is warm and dry.  Neurological:     General: No focal deficit present.     Mental Status: He is alert and oriented to person, place, and time.     Cranial Nerves: No cranial nerve deficit.  Psychiatric:        Mood and Affect: Mood normal.        Behavior: Behavior normal.    ED Results / Procedures / Treatments   Labs (all labs ordered are listed, but only abnormal results are displayed) Labs Reviewed  BASIC METABOLIC  PANEL - Abnormal; Notable for the following components:      Result Value   BUN 21 (*)    All other components within normal limits  CBC WITH DIFFERENTIAL/PLATELET    EKG None  Radiology CT Head Wo Contrast  Result Date: 06/02/2021 CLINICAL DATA:  Headache. EXAM: CT HEAD WITHOUT CONTRAST TECHNIQUE: Contiguous axial images were obtained from the base of the skull through the vertex without intravenous contrast. COMPARISON:  October 07, 2013. FINDINGS: Brain: No evidence of acute infarction, hemorrhage, hydrocephalus, extra-axial collection or mass lesion/mass effect. Vascular: No hyperdense vessel or unexpected calcification. Skull: Normal. Negative for fracture or focal lesion. Sinuses/Orbits: No acute finding. Other: None. IMPRESSION: No acute intracranial abnormality seen. Electronically  Signed   By: Lupita Raider M.D.   On: 06/02/2021 14:19    Procedures Procedures   Medications Ordered in ED Medications  ketorolac (TORADOL) 30 MG/ML injection 15 mg (has no administration in time range)    ED Course  I have reviewed the triage vital signs and the nursing notes.  Pertinent labs & imaging results that were available during my care of the patient were reviewed by me and considered in my medical decision making (see chart for details).  3:02 PM On repeat exam patient is in no distress.  I reviewed his CT imaging, labs, discussed them with him.  No evidence for acute intracranial findings.  Patient remains awake, alert, in no distress.  We discussed at length headaches, etiology, options for medication, options for follow-up.  With reassuring findings, no evidence for other acute phenomena patient discharged in stable condition. Final Clinical Impression(s) / ED Diagnoses Final diagnoses:  Bad headache    Rx / DC Orders ED Discharge Orders     None        Gerhard Munch, MD 06/02/21 1502

## 2021-06-02 NOTE — Discharge Instructions (Signed)
As discussed, your evaluation today has been largely reassuring.  But, it is important that you monitor your condition carefully, and do not hesitate to return to the ED if you develop new, or concerning changes in your condition. ? ?Otherwise, please follow-up with our physicians for appropriate ongoing care. ? ?

## 2021-06-02 NOTE — ED Triage Notes (Signed)
Patient c/o having headaches for the past week. Patient reports light and sound sensitivity. Patient states that he had blurred vision x 1 this week during a headache.  Patient denies N/V.

## 2021-06-02 NOTE — ED Provider Notes (Signed)
Emergency Medicine Provider Triage Evaluation Note  Jose Phillips , a 35 y.o. male  was evaluated in triage.  Pt complains of headache.  Patient does have a history of migraines when he was younger, but remitted for many years.  In the past few weeks the headaches have become more frequent and more severe.  Patient does not routinely seek routine medical checkups.   Review of Systems  Positive: Headache Negative:  Denies any unilateral weakness, nausea, vomiting, photophobia  Physical Exam  BP (!) 143/58 (BP Location: Right Arm)   Pulse 78   Temp 98 F (36.7 C) (Oral)   Resp 16   SpO2 99%  Gen:   Awake, no distress   Resp:  Normal effort  MSK:   Moves extremities without difficulty  Other:  Cranial nerves III through XII are grossly intact, no nystagmus.  Medical Decision Making  Medically screening exam initiated at 1:18 PM.  Appropriate orders placed.  Jose Phillips was informed that the remainder of the evaluation will be completed by another provider, this initial triage assessment does not replace that evaluation, and the importance of remaining in the ED until their evaluation is complete.  Given patient does not routinely go to the emergency department were routinely at medical care we will check basic labs.  We will proceed with CT head.   Theron Arista, PA-C 06/02/21 1320    Derwood Kaplan, MD 06/02/21 1346

## 2021-06-04 ENCOUNTER — Telehealth: Payer: Self-pay | Admitting: Internal Medicine

## 2021-06-04 NOTE — Telephone Encounter (Signed)
[address] [place] [phone]  Jose Phillips called to say he has had a headache for a week, he went to the ED over the weekend, he would like to come in one morning this week to be seen.

## 2021-06-04 NOTE — Telephone Encounter (Signed)
scheduled

## 2021-06-04 NOTE — Telephone Encounter (Signed)
LVM to CB to schedule appointment for headache

## 2021-06-05 ENCOUNTER — Encounter: Payer: Self-pay | Admitting: Internal Medicine

## 2021-06-05 ENCOUNTER — Other Ambulatory Visit: Payer: Self-pay

## 2021-06-05 ENCOUNTER — Ambulatory Visit (INDEPENDENT_AMBULATORY_CARE_PROVIDER_SITE_OTHER): Payer: BC Managed Care – PPO | Admitting: Internal Medicine

## 2021-06-05 VITALS — BP 134/78 | HR 63 | Temp 97.9°F | Ht 69.0 in | Wt 166.0 lb

## 2021-06-05 DIAGNOSIS — G43011 Migraine without aura, intractable, with status migrainosus: Secondary | ICD-10-CM

## 2021-06-05 DIAGNOSIS — Z820 Family history of epilepsy and other diseases of the nervous system: Secondary | ICD-10-CM

## 2021-06-05 MED ORDER — PREDNISONE 10 MG PO TABS: ORAL_TABLET | ORAL | 0 refills | Status: DC

## 2021-06-05 MED ORDER — RIZATRIPTAN BENZOATE 10 MG PO TABS: ORAL_TABLET | ORAL | 0 refills | Status: DC

## 2021-06-05 NOTE — Progress Notes (Signed)
   Subjective:    Patient ID: Jose Phillips, male    DOB: 1986-06-26, 35 y.o.   MRN: [id number]  HPI 35 year old Male seen for follow up on headaches.  Patient has worked  at [employer] for nearly 15 years.he also has a second job and recently took on a third job for extra money.  He went to the emergency department on October 29 complaining of headache which has been nearly every day recently.  History of photophobia but no vision loss, no vomiting.  Tried over-the-counter medication without consistent relief.  His general health is excellent.  He has been a patient in this practice since birth in [birth year].  Had bilateral inguinal hernia repair October 1999.  He had an ASD transcatheter closure at age 19 at [facility].  Takes no chronic medications and has no known drug allergies.  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  History of hypopigmented patches on his body.  Social history: He is single.  He graduated from [employer] with a concentration in business.  He stays in excellent physical condition.  Does not drink alcohol.  He does not smoke.  Occasionally admits to using marijuana.  Family history: Father with history of hypertension and chronic kidney disease as well as history of GIST tumor of the stomach.  Sister with history of narcolepsy.  Mother with history of lumbar stenosis.  Mother with history of migraines.  History of gonorrhea and chlamydia in 2009.  History of chickenpox 52.  He had CT without contrast in the emergency department on October 29 showing no evidence of infarction, hemorrhage, hydrocephalus or mass lesion.  He was given IM Toradol and discharged home.  He has no prior history of migraine headaches but would appear to have a protracted migraine headache based on history.  Not sure what is triggering this other than lack of sleep since he is now working 3 jobs.  Review of Systems reviewed nature of the headache with him.  Does not  recall any foods that would trigger the headache.     Objective:   Physical Exam His blood pressure is 138/78.  Pulse is 63 regular pulse oximetry 98% weight 166 pounds BMI 24.51 Muscular male in no acute distress.  Feels relieved about CT being normal.  Cranial nerves II through XII are grossly intact.  PERRLA.  Extraocular movements are full.  Muscle strength in the upper and lower extremities is normal.  Deep tendon reflexes 2+ and symmetrical in the knees.  PERRLA.  Affect thought and judgment appear to be normal.  Cerebellar finger-to-nose testing is normal.      Assessment & Plan:  Migraine headache-do not know what prompted this other than perhaps lack of sleep.  He occasionally smokes marijuana.  Does not consume alcohol.  Denies having relationship issues at this point time or being depressed.  Likely fatigued from taking on third job.  Advised him that this could contribute to developing protracted headache.  Plan: Have placed him on prednisone 10 mg tablets (#21) going from 60 mg to 0 mg over 7 days.  If he has recurrence of migraines recommend Maxalt 1 tablet at onset of migraine and may repeat in 2 hours as needed.  He may benefit from Neurology consultation.  I think he is truly concerned there may be something seriously wrong.  Tried to reassure him there are no focal deficits on his exam and that his CT was negative.

## 2021-06-05 NOTE — Telephone Encounter (Signed)
Glenda was at the pharmacy, so he called to check on medication, I let him know you have not had time to send in, you will get to it later tonight.

## 2021-06-13 NOTE — Telephone Encounter (Signed)
Jose Phillips called to say he has completed the prednisone and he is still having headaches ever day. He threw the other medicine away and is not going to take that because of the side effects. He does not want to take medication and be drug up.He stated he needs some help he can not stand these headaches what does he needs to do.

## 2021-06-13 NOTE — Telephone Encounter (Signed)
scheduled

## 2021-06-15 ENCOUNTER — Ambulatory Visit (INDEPENDENT_AMBULATORY_CARE_PROVIDER_SITE_OTHER): Payer: BC Managed Care – PPO | Admitting: Internal Medicine

## 2021-06-15 ENCOUNTER — Other Ambulatory Visit: Payer: Self-pay

## 2021-06-15 ENCOUNTER — Encounter: Payer: Self-pay | Admitting: Internal Medicine

## 2021-06-15 VITALS — BP 120/88 | HR 86 | Temp 97.7°F | Resp 16 | Ht 69.0 in | Wt 162.0 lb

## 2021-06-15 DIAGNOSIS — F4321 Adjustment disorder with depressed mood: Secondary | ICD-10-CM

## 2021-06-16 ENCOUNTER — Other Ambulatory Visit: Payer: Self-pay

## 2021-06-16 ENCOUNTER — Emergency Department (HOSPITAL_COMMUNITY)
Admission: EM | Admit: 2021-06-16 | Discharge: 2021-06-16 | Disposition: A | Discharge status: Home / Self Care | Payer: BC Managed Care – PPO | Attending: Emergency Medicine | Admitting: Emergency Medicine

## 2021-06-16 ENCOUNTER — Encounter (HOSPITAL_COMMUNITY): Payer: Self-pay

## 2021-06-16 DIAGNOSIS — R519 Headache, unspecified: Secondary | ICD-10-CM | POA: Insufficient documentation

## 2021-06-16 DIAGNOSIS — G8929 Other chronic pain: Secondary | ICD-10-CM

## 2021-06-16 NOTE — Discharge Instructions (Signed)
You were seen today for evaluation of your headache.  Given your benign physical exam findings and unchanged quality of headaches from your thorgough work-up on 10/29, I feel that outpatient follow-up with neurology on 12/8 is sufficient at this time.  As we discussed, CT imaging exposes you to significant amounts of radiation and therefore I do not feel like this is warranted at this time.  As we discussed make sure that you are drinking plenty of water, getting plenty of sleep, and doing your best to reduce stress levels.  Additionally only take over-the-counter pain medication as required for headache relief as taking more than required can cause drug rebound headaches.  Return if development of new or worsening symptoms

## 2021-06-16 NOTE — ED Triage Notes (Signed)
Pt reports he has been having recurrent headaches. Was given steroids with minimal relief. Pt was wanting to have another CT scan of his head.

## 2021-06-16 NOTE — ED Provider Notes (Signed)
[facility]-EMERGENCY DEPT Provider Note   CSN: [id number] Arrival date & time: 06/16/21  1514     History Chief Complaint  Patient presents with   Headache    Jose Phillips is a 35 y.o. male.  Patient with history of migraines presents today with chief complaint of headache.  He states that same has been persistent for over a month now.  He was seen on 10/29 for same, completed thorough work-up without acute findings.  He was discharged and given prednisone which he completed and states that the prednisone gave him resolution of his migraines, however he had several undesirable side effects to the medication including night sweats anxiety and tachycardia.  He was unaware that these side effects were related to the medication, and therefore presented to the ER today for further evaluation and with concerned that the prednisone could have also caused worsening intracranial abnormalities.  He states that he currently does not have a headache, and that his headaches have decreased in frequency since he was seen on 10/29 but have been unchanged in quality since he was here last.  He does endorse increased amounts of over-the-counter pain medication for seeking relief of his headaches.  Denies fevers, chills, vision changes, dizziness. Of note, he does have a neurology appointment on 12/08 for further evaluation of this. He does endorse that he has been able to manage his headaches with OTC Excedrin.  The history is provided by the patient. No language interpreter was used.  Headache Associated symptoms: no congestion, no cough, no dizziness, no drainage, no fatigue, no fever, no nausea, no neck pain, no neck stiffness, no numbness, no seizures, no sore throat, no vomiting and no weakness       History reviewed. No pertinent past medical history.  Patient Active Problem List   Diagnosis Date Noted   History of migraine headaches 10/24/2013   History of percutaneous  transcatheter closure of congenital ASD 10/08/2012    Past Surgical History:  Procedure Laterality Date   ASD REPAIR     HERNIA REPAIR         Family History  Problem Relation Age of Onset   Hypertension Father     Social History   Tobacco Use   Smoking status: Never   Smokeless tobacco: Never   Tobacco comments:    "WEED" COUPLE OF TIMES A MONTH   Vaping Use   Vaping Use: Never used  Substance Use Topics   Alcohol use: Yes    Comment: socially   Drug use: Yes    Types: Marijuana    Home Medications Prior to Admission medications   Not on File    Allergies    Patient has no known allergies.  Review of Systems   Review of Systems  Constitutional:  Negative for chills, fatigue and fever.  HENT:  Negative for congestion, postnasal drip, rhinorrhea, sore throat, trouble swallowing and voice change.   Respiratory:  Negative for cough and shortness of breath.   Gastrointestinal:  Negative for nausea and vomiting.  Musculoskeletal:  Negative for neck pain and neck stiffness.  Skin:  Negative for rash.  Neurological:  Positive for headaches. Negative for dizziness, tremors, seizures, syncope, facial asymmetry, speech difficulty, weakness, light-headedness and numbness.  Psychiatric/Behavioral:  Negative for confusion and decreased concentration.   All other systems reviewed and are negative.  Physical Exam Updated Vital Signs BP 124/89 (BP Location: Left Arm)   Pulse 78   Temp 98.3 F (36.8 C)  Resp 17   SpO2 99%   Physical Exam Vitals and nursing note reviewed.  Constitutional:      General: He is not in acute distress.    Appearance: Normal appearance. He is well-developed and normal weight. He is not ill-appearing, toxic-appearing or diaphoretic.     Comments: Well-appearing male sitting in chair in no acute distress speaking in complete sentences  HENT:     Head: Normocephalic and atraumatic.  Eyes:     General: No visual field deficit.     Extraocular Movements: Extraocular movements intact.     Pupils: Pupils are equal, round, and reactive to light.  Cardiovascular:     Rate and Rhythm: Normal rate and regular rhythm.     Heart sounds: Normal heart sounds.  Pulmonary:     Effort: Pulmonary effort is normal.     Breath sounds: Normal breath sounds.  Abdominal:     Palpations: Abdomen is soft.  Musculoskeletal:        General: Normal range of motion.     Cervical back: Normal range of motion and neck supple.  Skin:    General: Skin is warm and dry.  Neurological:     Mental Status: He is alert and oriented to person, place, and time.     GCS: GCS eye subscore is 4. GCS verbal subscore is 5. GCS motor subscore is 6.     Cranial Nerves: No cranial nerve deficit, dysarthria or facial asymmetry.     Sensory: Sensation is intact. No sensory deficit.     Motor: Motor function is intact. No weakness.     Coordination: Coordination is intact. Romberg sign negative. Coordination normal.     Gait: Gait is intact. Gait normal.     Comments: Alert and oriented to self, place, time and event.    Speech is fluent, clear without dysarthria or dysphasia.    Strength 5/5 in upper/lower extremities   Sensation intact in upper/lower extremities    CN I not tested  CN II grossly intact visual fields bilaterally. Did not visualize posterior eye.  CN III, IV, VI PERRLA and EOMs intact bilaterally  CN V Intact sensation to sharp and light touch to the face  CN VII facial movements symmetric  CN VIII not tested  CN IX, X no uvula deviation, symmetric rise of soft palate  CN XI 5/5 SCM and trapezius strength bilaterally  CN XII Midline tongue protrusion, symmetric L/R movements   Psychiatric:        Mood and Affect: Mood normal.        Behavior: Behavior normal.    ED Results / Procedures / Treatments   Labs (all labs ordered are listed, but only abnormal results are displayed) Labs Reviewed - No data to  display  EKG None  Radiology No results found.  Procedures Procedures   Medications Ordered in ED Medications - No data to display  ED Course  I have reviewed the triage vital signs and the nursing notes.  Pertinent labs & imaging results that were available during my care of the patient were reviewed by me and considered in my medical decision making (see chart for details).    MDM Rules/Calculators/A&P                         Patient evaluated today in triage for headache.  He states that he does not currently have a headache.  Upon exam, patient is walking around the  room watching football in no acute distress.  He states that the only reason he came in today was to get a CT scan due to concerning side effects from his prednisone.  Of note, he did have a thorough work-up on 10/29 including CT head which was unremarkable for acute findings.  He states that his headaches have decreased in quantity and have been unchanged in quality since his evaluation on 10/29.  Thorough discussion with patient was had about risks versus benefits of CT imaging.  Full neuro exam was performed without deficits.  Discussed with patient that if his headaches have been unchanged since his previous work-up that I did not feel that additional imaging was indicated.  Offered patient a headache cocktail which he declined as he is not currently symptomatic.  Patient was unaware that his tachycardia and anxiety were common side effects of prednisone.  Reassurance given that low suspicion of prednisone causing acute intracranial abnormalities.  Patient request to be discharged at this time.  Given normal neuro exam and unchanged findings from previous work-up in the presence of no headache at the current moment, I feel that patient can be cleared for discharge at this time.  He has been educated on red flag symptoms that would prompt immediate return.  He has a neurology appointment on 12/8, encouraged that he make this  appointment for further evaluation of his headaches.  Patient discharged in stable condition.   Final Clinical Impression(s) / ED Diagnoses Final diagnoses:  Chronic nonintractable headache, unspecified headache type    Rx / DC Orders ED Discharge Orders     None     An After Visit Summary was printed and given to the patient.    Vear Clock 06/16/21 2124    Tegeler, Canary Brim, MD 06/17/21 [phone]

## 2021-06-18 NOTE — Patient Instructions (Signed)
Agree with appointment with [facility] neurology to assist migraine headaches in the near future.  Try to eat well, stay well-hydrated and get adequate rest.

## 2021-06-18 NOTE — Progress Notes (Signed)
   Subjective:    Patient ID: Jose Phillips, male    DOB: [birth year], 35 y.o.   MRN: [id number]  HPI patient was seen here on November 1 after presenting to the emergency department with an intractable headache diagnosed as migraine without aura.  He had a negative CT in the emergency department on October 29.  Was given a Toradol injection.  He was seen here on November 1 and still had some headache.  Was given tapering course of prednisone and a prescription for Maxalt to take at onset of acute migraine headache.  Reassured that his neurological exam was normal.  He subsequently called and told front office person that he was not going to take prednisone or Maxalt.  He apparently had concerns about these medications.  He is now here today to discuss headaches.  He has an appointment with [facility] Neurology soon.  Apparently a good friend died recently and this was quite upsetting to him.  Says he has given up 1 of 3 jobs he had.  Says he has to go to work today at 1 PM and has a limited amount of time to discuss these issues.  Tried to reassure him the CT scan was normal and that his neurological exam at last visit was also normal.    Review of Systems seems to be having grief reaction from loss of friend     Objective:   Physical Exam Brief neurological exam is within normal limits without focal deficits.  Tearful when describing feelings of being distraught at friend's passing.       Assessment & Plan:  Migraine headaches without aura-seem to be worse lately and follow-up exacerbated by taking on additional job and not getting enough rest  Family history of migraines in mother  Plan: Says he does not want to take Maxalt for migraine.  He has neurology consultation upcoming in the near future.  I think he needs some reassurance that he does not have a neurological condition that is concerning.  Reminded him CT of the brain in the emergency department was normal.  I am glad he has  given up his third job.  He needs to take care of himself and get adequate rest.  Reminded him of certain foods that could trigger headaches.  When seen in the emergency department on October 29 CBC with differential and c-Met were normal

## 2021-06-18 NOTE — Patient Instructions (Addendum)
Recommend taking prednisone in tapering course as directed for what seems to be a migraine headache that is protracted.  If headache recurs try Maxalt as soon as possible to achieve relief.  Reviewed need for adequate sleep.  May be not getting enough rest working 3 jobs.  Reviewed foods that can contribute to headaches.

## 2021-07-12 ENCOUNTER — Ambulatory Visit (INDEPENDENT_AMBULATORY_CARE_PROVIDER_SITE_OTHER): Payer: BC Managed Care – PPO | Admitting: Psychiatry

## 2021-07-12 ENCOUNTER — Encounter: Payer: Self-pay | Admitting: Psychiatry

## 2021-07-12 VITALS — BP 114/70 | HR 76 | Ht 69.0 in | Wt 170.4 lb

## 2021-07-12 DIAGNOSIS — G43001 Migraine without aura, not intractable, with status migrainosus: Secondary | ICD-10-CM

## 2021-07-12 MED ORDER — KETOROLAC TROMETHAMINE 10 MG PO TABS: 10.0000 mg | ORAL_TABLET | Freq: Three times a day (TID) | ORAL | 0 refills | Status: DC

## 2021-07-12 NOTE — Patient Instructions (Addendum)
Take Toradol up to three times a day for the next five days to help break your headaches. Do not take excedrin or ibuprofen while taking this medication.

## 2021-07-12 NOTE — Progress Notes (Signed)
Referring:  Margaree Mackintosh, MD [address] [place],  [place] [address]  PCP: Margaree Mackintosh, MD  Neurology was asked to evaluate Jose Phillips, a 35 year old male for a chief complaint of headaches.  Our recommendations of care will be communicated by shared medical record.    CC:  headaches  HPI:  Medical co-morbidities: migraines  The patient presents for evaluation of headaches which began at the end of October 2022. Had a throbbing headache that entire week. It was associated with photophobia, phonophobia, and nausea. He was started on prednisone at the beginning of which caused diaphoresis and trouble sleeping. Felt his headaches worsened with the prednisone. Headaches then became intermittent but still daily.  This is the first week he has not had any severe headaches but will have some mild throbbing.  He does have a history of migraines but they were never as severe as this. They would resolve with Excedrin. Most recent headache did not improve with OTCs.  CTH on 06/02/21 was unremarkable.  Smokes marijuana and wonders if it may have been laced with something that triggered his headache.  Headache History: Onset: October 2022 Aura: blurred vision Location: right retro-orbital, temporal Quality/Description: throbbing, piercing Associated Symptoms:  Photophobia: yes  Phonophobia: yes  Nausea: yes Vomiting: no Allodynia: yes Other symptoms: denies ipsilateral autonomic symptoms Worse with activity?: yes Duration of headaches: 2-3 hours  Headache days per month: 21 Headache free days per month: 7  Current Treatment: Abortive Excedrin  Preventative none  Prior Therapies                                 Prednisone - palpitations, jitteriness   Headache Risk Factors: Headache risk factors and/or co-morbidities (-) Neck Pain (-) History of Motor Vehicle Accident (-) History of Traumatic Brain Injury and/or Concussion  LABS: CBC    Component  Value Date/Time   WBC 5.1 06/02/2021 1410   RBC 5.55 06/02/2021 1410   HGB 15.4 06/02/2021 1410   HCT 46.9 06/02/2021 1410   PLT 274 06/02/2021 1410   MCV 84.5 06/02/2021 1410   MCH 27.7 06/02/2021 1410   MCHC 32.8 06/02/2021 1410   RDW 13.6 06/02/2021 1410   LYMPHSABS 1.1 06/02/2021 1410   MONOABS 0.3 06/02/2021 1410   EOSABS 0.1 06/02/2021 1410   BASOSABS 0.1 06/02/2021 1410   BMP Latest Ref Rng & Units 06/02/2021 07/03/2020 04/30/2018  Glucose 70 - 99 mg/dL 85 91 944(H)  BUN 6 - 20 mg/dL 67(R) 17 91(M)  Creatinine 0.61 - 1.24 mg/dL 3.84 6.65 9.93  BUN/Creat Ratio 6 - 22 (calc) - NOT APPLICABLE -  Sodium 135 - 145 mmol/L 136 142 141  Potassium 3.5 - 5.1 mmol/L 4.1 4.7 3.9  Chloride 98 - 111 mmol/L 107 107 108  CO2 22 - 32 mmol/L 23 30 27   Calcium 8.9 - 10.3 mg/dL 9.3 9.8 9.6     IMAGING:  CTH 06/02/21: unremarkable  Imaging independently reviewed on July 12, 2021   No current outpatient medications on file prior to visit.   No current facility-administered medications on file prior to visit.     Allergies: No Known Allergies  Family History: Migraine or other headaches in the family:  mother Aneurysms in a first degree relative:  no Brain tumors in the family:  no Other neurological illness in the family:   no  Past Medical History: Past Medical History:  Diagnosis  Date   Headache     Past Surgical History Past Surgical History:  Procedure Laterality Date   ASD REPAIR     HERNIA REPAIR      Social History: Social History   Tobacco Use   Smoking status: Never   Smokeless tobacco: Never   Tobacco comments:    "WEED" COUPLE OF TIMES A MONTH   Vaping Use   Vaping Use: Never used  Substance Use Topics   Alcohol use: Yes    Comment: socially   Drug use: Yes    Types: Marijuana    Comment: 07/12/21 denies    ROS: Negative for fevers, chills. Positive for headaches. All other systems reviewed and negative unless stated otherwise in  HPI.   Physical Exam:   Vital Signs: BP 114/70   Pulse 76   Ht 5\' 9"  (1.753 m)   Wt 170 lb 6.4 oz (77.3 kg)   BMI 25.16 kg/m  GENERAL: well appearing,in no acute distress,alert SKIN:  Color, texture, turgor normal. No rashes or lesions HEAD:  Normocephalic/atraumatic. CV:  RRR RESP: Normal respiratory effort MSK: no tenderness to palpation over occiput, neck, or shoulders  NEUROLOGICAL: Mental Status: Alert, oriented to person, place and time,Follows commands Cranial Nerves: PERRL,visual fields intact to confrontation,extraocular movements intact,facial sensation intact,no facial droop or ptosis,hearing intact to finger rub bilaterally,no dysarthria, Motor: muscle strength 5/5 both upper and lower extremities,no drift, normal tone Reflexes: 2+ throughout Sensation: intact to light touch all 4 extremities Coordination: Finger-to- nose-finger intact bilaterally Gait: normal-based   IMPRESSION: 35 year old male who presents for evaluation of headaches which began at the end of October 2022. CTH was unremarkable. Headache pattern sounds most consistent with status migrainosus. Had a long conversation regarding possible migraine triggers and medication options. He is very hesitant to take any medications due to prior side effects from steroids. He is agreeable to taking a 5 day course of Toradol to help break his current headache.  PLAN: -Toradol 10 mg TID with meals x 5 days to break current headache -Follow up if headaches worsen or fail to improve   I spent a total of 56 minutes chart reviewing and counseling the patient. Headache education was done. Discussed treatment options including preventive and acute medications, and natural supplements.Discussed medication side effects, adverse reactions and drug interactions. Written educational materials and patient instructions outlining all of the above were given.  Follow-up: as needed   November 2022, MD 07/12/2021   3:54  PM

## 2021-07-23 ENCOUNTER — Telehealth: Payer: Self-pay | Admitting: Psychiatry

## 2021-07-23 NOTE — Telephone Encounter (Signed)
Contacted pt, advised him Dr Delena Bali recommends him to continue the medication, I did advise the 15 tab should last him a month and should only take as needed. He doesn't have to take 3 a day for 5 days if its not needed but did suggest he trial it and let us know how he tolerates it. He understood and was agreeable.

## 2021-07-23 NOTE — Telephone Encounter (Addendum)
Contacted pt back, he wasn't really giving me a clear answer of what he wanted to speak about. Reiterated to pt Dr Delena Bali is seeing pts currently at the moment and may not be able to take a call by 1pm. He stated he didn't want to speak to me, he wanted to speak to her directly and I asked what was going on with the medication so I could let her know, he stated I had a smart mouth, when I was trying get clarity on what he needed. He eventually states he just wanted to make sure this medication is safe for him. Stated to LVM if he is at work and does not answer. Please advise

## 2021-07-23 NOTE — Telephone Encounter (Signed)
Pt called has a quick questions about his ketorolac (TORADOL) 10 MG tablet. Pt goes to work at 1 would like to be called back before 1:00 pm.

## 2021-08-31 ENCOUNTER — Other Ambulatory Visit: Payer: Self-pay

## 2021-08-31 ENCOUNTER — Other Ambulatory Visit: Payer: BC Managed Care – PPO | Admitting: Internal Medicine

## 2021-08-31 DIAGNOSIS — Z0000 Encounter for general adult medical examination without abnormal findings: Secondary | ICD-10-CM

## 2021-08-31 DIAGNOSIS — Z13220 Encounter for screening for lipoid disorders: Secondary | ICD-10-CM

## 2021-09-01 LAB — CBC WITH DIFFERENTIAL/PLATELET
Absolute Monocytes: 243 cells/uL (ref 200–950)
Basophils Absolute: 59 cells/uL (ref 0–200)
Basophils Relative: 1.3 %
Eosinophils Absolute: 212 cells/uL (ref 15–500)
Eosinophils Relative: 4.7 %
HCT: 48.7 % (ref 38.5–50.0)
Hemoglobin: 16 g/dL (ref 13.2–17.1)
Lymphs Abs: 1638 cells/uL (ref 850–3900)
MCH: 27.4 pg (ref 27.0–33.0)
MCHC: 32.9 g/dL (ref 32.0–36.0)
MCV: 83.4 fL (ref 80.0–100.0)
MPV: 9.7 fL (ref 7.5–12.5)
Monocytes Relative: 5.4 %
Neutro Abs: 2349 cells/uL (ref 1500–7800)
Neutrophils Relative %: 52.2 %
Platelets: 266 10*3/uL (ref 140–400)
RBC: 5.84 10*6/uL — ABNORMAL HIGH (ref 4.20–5.80)
RDW: 12.8 % (ref 11.0–15.0)
Total Lymphocyte: 36.4 %
WBC: 4.5 10*3/uL (ref 3.8–10.8)

## 2021-09-01 LAB — COMPLETE METABOLIC PANEL WITH GFR
AG Ratio: 1.7 (calc) (ref 1.0–2.5)
ALT: 12 U/L (ref 9–46)
AST: 23 U/L (ref 10–40)
Albumin: 4.5 g/dL (ref 3.6–5.1)
Alkaline phosphatase (APISO): 66 U/L (ref 36–130)
BUN: 18 mg/dL (ref 7–25)
CO2: 29 mmol/L (ref 20–32)
Calcium: 10.1 mg/dL (ref 8.6–10.3)
Chloride: 105 mmol/L (ref 98–110)
Creat: 1.17 mg/dL (ref 0.60–1.26)
Globulin: 2.6 g/dL (calc) (ref 1.9–3.7)
Glucose, Bld: 94 mg/dL (ref 65–99)
Potassium: 4.5 mmol/L (ref 3.5–5.3)
Sodium: 140 mmol/L (ref 135–146)
Total Bilirubin: 0.7 mg/dL (ref 0.2–1.2)
Total Protein: 7.1 g/dL (ref 6.1–8.1)
eGFR: 84 mL/min/{1.73_m2} (ref >=60)

## 2021-09-01 LAB — LIPID PANEL
Cholesterol: 155 mg/dL (ref <200)
HDL: 68 mg/dL (ref >=40)
LDL Cholesterol (Calc): 75 mg/dL (calc)
Non-HDL Cholesterol (Calc): 87 mg/dL (calc) (ref <130)
Total CHOL/HDL Ratio: 2.3 (calc) (ref <5.0)
Triglycerides: 50 mg/dL (ref <150)

## 2021-09-03 ENCOUNTER — Ambulatory Visit (INDEPENDENT_AMBULATORY_CARE_PROVIDER_SITE_OTHER): Payer: BC Managed Care – PPO | Admitting: Internal Medicine

## 2021-09-03 ENCOUNTER — Other Ambulatory Visit: Payer: Self-pay

## 2021-09-03 ENCOUNTER — Encounter: Payer: Self-pay | Admitting: Internal Medicine

## 2021-09-03 VITALS — BP 128/82 | HR 83 | Temp 97.2°F | Ht 69.0 in | Wt 169.5 lb

## 2021-09-03 DIAGNOSIS — Z0000 Encounter for general adult medical examination without abnormal findings: Secondary | ICD-10-CM

## 2021-09-03 DIAGNOSIS — Z113 Encounter for screening for infections with a predominantly sexual mode of transmission: Secondary | ICD-10-CM | POA: Diagnosis not present

## 2021-09-03 LAB — POCT URINALYSIS DIPSTICK
Bilirubin, UA: NEGATIVE
Blood, UA: NEGATIVE
Glucose, UA: NEGATIVE
Ketones, UA: NEGATIVE
Leukocytes, UA: NEGATIVE
Nitrite, UA: NEGATIVE
Protein, UA: NEGATIVE
Spec Grav, UA: 1.025 (ref 1.010–1.025)
Urobilinogen, UA: 4 E.U./dL — AB
pH, UA: 6 (ref 5.0–8.0)

## 2021-09-03 NOTE — Progress Notes (Signed)
° °Subjective:  ° ° Patient ID: Jose Phillips, male    DOB: 10/19/1985, 35 y.o.   MRN: [id number] ° °HPI 35 year old Male seen for annual health maintenance exam.  His general health is excellent.  He is in excellent physical condition.  Continues to work at [employer] where he gets plenty of exercise.  He feels well without complaints. ° °He has been a patient here since birth in 1987.  I initially saw him as an infant at [facility].  He weighed 6 pounds 13 ounces.  Pregnancy was normal.  His general health is always been excellent. 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  History of hypopigmented patches on his body.  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  This is not of concern at this point in time. ° °He had chickenpox in 1990.  He had an ASD transcatheter closure at age 15 at [facility] and has no cardiac issues whatsoever. ° °No chronic medications.  No known drug allergies.  History of chickenpox in 1990. ° °Social history: He is single.  Graduated from [employer] with a concentration and business.  He works at [employer].  He is in excellent physical condition.  He does not drink alcohol smoke or use drugs. ° °He had tetanus update given in 2014. ° °He had both gonorrhea and chlamydia in 2009. ° °Family history: Father with history of hypertension and chronic kidney disease.  Mother with history of lumbar stenosis.  1 sister with history of narcolepsy. ° °He request STD testing.  Tested for chlamydia and gonorrhea both of which were negative. ° °CBC, c-Met, lipid panel are all within normal limits.  His urine dipstick is reviewed and considered normal.  This is a concentrated specimen.  ° °Review of Systems no new complaints ° °   °Objective:  ° Physical Exam °Constitutional:   °   General: He is not in acute distress. °   Appearance: Normal appearance.  °HENT:  °   Head: Normocephalic and atraumatic.  °   Right Ear: Tympanic membrane normal.  °   Left Ear:  Tympanic membrane normal.  °   Nose: Nose normal.  °   Mouth/Throat:  °   Pharynx: Oropharynx is clear.  °Eyes:  °   General: No scleral icterus.    °   Right eye: No discharge.     °   Left eye: No discharge.  °   Extraocular Movements: Extraocular movements intact.  °   Pupils: Pupils are equal, round, and reactive to light.  °Neck:  °   Vascular: No carotid bruit.  °Cardiovascular:  °   Rate and Rhythm: Normal rate and regular rhythm.  °   Heart sounds: No murmur heard. °Pulmonary:  °   Effort: Pulmonary effort is normal.  °Abdominal:  °   General: Bowel sounds are normal. There is no distension.  °   Palpations: Abdomen is soft. There is no mass.  °   Tenderness: There is no right CVA tenderness, left CVA tenderness, guarding or rebound.  °   Hernia: No hernia is present.  °Genitourinary: °   Comments: No hernias °Musculoskeletal:  °   Cervical back: No tenderness.  °   Right lower leg: No edema.  °   Left lower leg: No edema.  °Lymphadenopathy:  °   Cervical: No cervical adenopathy.  °Skin: °   General: Skin is warm and dry.  °Neurological:  °     General: No focal deficit present.  °   Mental Status: He is alert.  °Psychiatric:     °   Mood and Affect: Mood normal.     °   Behavior: Behavior normal.     °   Thought Content: Thought content normal.     °   Judgment: Judgment normal.  ° °His vital signs are reviewed.  Blood pressure 128/82 weight 169 pounds.  BMI 25.03 ° ° ° °   °Assessment & Plan:  ° °Normal health maintenance exam ° °Plan: Return in 1 year or as needed.  His tetanus immunization is up-to-date. °

## 2021-09-04 LAB — C. TRACHOMATIS/N. GONORRHOEAE RNA
C. trachomatis RNA, TMA: NOT DETECTED
N. gonorrhoeae RNA, TMA: NOT DETECTED

## 2021-09-29 NOTE — Patient Instructions (Signed)
Your labs are within normal limits and your health maintenance exam today is normal.  It was a pleasure to see you today.  Return in 1 year.

## 2021-11-16 ENCOUNTER — Encounter: Payer: Self-pay | Admitting: Internal Medicine

## 2021-11-16 ENCOUNTER — Ambulatory Visit: Payer: BC Managed Care – PPO | Admitting: Internal Medicine

## 2021-11-16 VITALS — HR 92 | Wt 167.8 lb

## 2021-11-16 DIAGNOSIS — S9031XA Contusion of right foot, initial encounter: Secondary | ICD-10-CM | POA: Diagnosis not present

## 2021-11-16 DIAGNOSIS — S86911A Strain of unspecified muscle(s) and tendon(s) at lower leg level, right leg, initial encounter: Secondary | ICD-10-CM

## 2021-11-19 ENCOUNTER — Telehealth: Payer: Self-pay

## 2021-11-19 ENCOUNTER — Other Ambulatory Visit: Payer: Self-pay

## 2021-11-19 DIAGNOSIS — M25561 Pain in right knee: Secondary | ICD-10-CM

## 2021-11-19 NOTE — Telephone Encounter (Signed)
Patient decided to take the week off from both jobs and rest. He will go back next week light duty. He did decide to do the knee xray so I have sent the order to GI as requested.  ?

## 2021-11-21 ENCOUNTER — Ambulatory Visit
Admission: RE | Admit: 2021-11-21 | Discharge: 2021-11-21 | Disposition: A | Discharge status: Home / Self Care | Payer: BC Managed Care – PPO | Source: Ambulatory Visit | Attending: Internal Medicine | Admitting: Internal Medicine

## 2021-11-21 ENCOUNTER — Telehealth: Payer: Self-pay

## 2021-11-21 DIAGNOSIS — M25561 Pain in right knee: Secondary | ICD-10-CM

## 2021-11-21 DIAGNOSIS — S99921A Unspecified injury of right foot, initial encounter: Secondary | ICD-10-CM

## 2021-11-21 NOTE — Telephone Encounter (Signed)
Patient states that he thought he was going to get xray of foot and knee but just the knee was done. Please advise if he should have a foot xray as well. ?

## 2021-11-23 ENCOUNTER — Ambulatory Visit
Admission: RE | Admit: 2021-11-23 | Discharge: 2021-11-23 | Disposition: A | Discharge status: Home / Self Care | Payer: BC Managed Care – PPO | Source: Ambulatory Visit | Attending: Internal Medicine | Admitting: Internal Medicine

## 2021-11-23 DIAGNOSIS — S99921A Unspecified injury of right foot, initial encounter: Secondary | ICD-10-CM

## 2021-11-26 ENCOUNTER — Telehealth: Payer: Self-pay | Admitting: Internal Medicine

## 2021-11-26 ENCOUNTER — Encounter: Payer: Self-pay | Admitting: Internal Medicine

## 2021-11-26 NOTE — Telephone Encounter (Signed)
Printed and ready for pick up. 

## 2021-11-26 NOTE — Telephone Encounter (Signed)
Toni Arthurs ?[phone] ? ?Jose Phillips was calling to inquire about results of his foot xray. He stated he was going back to work today, he had taking last week off for vacation instead of doing lite duty and he was wandering if it would be okay to go back to normal duty. I did let him know that they had been behind at imaging reading them, so I was not sure if it had been read yet. Please call him back and advise. ?

## 2021-11-27 NOTE — Telephone Encounter (Signed)
Spoke with [place], he is coming by before lunch to pick up, ?

## 2021-12-01 NOTE — Patient Instructions (Addendum)
Plan to x-ray right knee and if you think necessary necessary right foot.  May use Voltaren gel and apply ice on foot and knee for 20 minutes 2 or 3 times daily.  Let us know if you need note to be out of work. ?

## 2021-12-01 NOTE — Progress Notes (Signed)
? ?  Subjective:  ? ? Patient ID: Jose Phillips, male    DOB: [birth year], 36 y.o.   MRN: [id number] ? ?HPI 36 year old Male currently working 2 jobs. 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 Involves, lifting and pulling as well as pushing large items. ? 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 ? ?At some point a heavy object dropped on hi right LE. Has had pain and discomfort in right lower leg and foot. May have twisted his right knee as well. ? 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 ? ?He is in excellent physical shape.  His BMI is 24.78. ? ?His general health is excellent. ? ? ? ?Review of Systems see above ? ?   ?Objective:  ? Physical Exam ?He has normal vital signs.  He has no right knee effusion.  He has no significant tenderness along the right lateral collateral ligament nor the right medial collateral ligament.  There is no obvious injury of the right leg knee or foot. ? ?May have some tenderness along top of his right foot but no significant swelling redness and no abraded areas. ? ? ? ?   ?Assessment & Plan:  ?Right lower extremity injury ? ?Plan: I am happy to excuse him from work for a few days if he so desires.  I think he should have an x-ray of knee and foot.  Apply ice 20 minutes 2 or 3 times a day to injured areas.  May use Voltaren gel. ? 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  Right knee x-ray shows no evidence of fracture dislocation or joint effusion.  No arthropathy or bony abnormality. ? ?I would expect a full recovery. ? ?

## 2022-02-22 ENCOUNTER — Ambulatory Visit: Payer: BC Managed Care – PPO | Admitting: Internal Medicine

## 2022-02-22 ENCOUNTER — Encounter: Payer: Self-pay | Admitting: Internal Medicine

## 2022-02-22 VITALS — BP 120/78 | HR 88 | Temp 98.6°F

## 2022-02-22 DIAGNOSIS — M79604 Pain in right leg: Secondary | ICD-10-CM | POA: Diagnosis not present

## 2022-02-22 DIAGNOSIS — M545 Low back pain, unspecified: Secondary | ICD-10-CM | POA: Diagnosis not present

## 2022-02-22 MED ORDER — METHYLPREDNISOLONE ACETATE 80 MG/ML IJ SUSP
80.0000 mg | Freq: Once | INTRAMUSCULAR | Status: AC
  Administered 2022-02-22: 80 mg via INTRAMUSCULAR

## 2022-02-22 NOTE — Progress Notes (Signed)
   Subjective:    Patient ID: Jose Phillips, male    DOB: 11-12-1985, 36 y.o.   MRN: [id number]  HPI He is a pleasant 36 year old Male I have known since birth as I have been his  primary care physician since birth. 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 There is some heavy lifting but he won't tell me exactly how many pounds at a time.  In April 2023, a heavy object dropped on his right lower extremity.  He had pain and discomfort in right lower leg and foot.  He thought he might of twisted his right knee as well but did not report the injury to the supervisor at his work.  His knee exam was unremarkable.  He had an x-ray of his right foot and his right knee.  He had mild midfoot osteoarthritis.  This was in the tarsometatarsal joint with osteophyte formation.  No hematoma was present.  Knee x-ray was completely negative.  Recently he has felt some discomfort in his right lower extremity.  He wonders if it is related to that accident.  I do not think so.  He feels some discomfort in his right hip/buttock area and also along his right knee and right thigh.  Sometimes he feels some discomfort in his right foot.  Review of Systems see above-he describes some radiculopathy type symptoms with discomfort radiating from his buttock into his right leg.     Objective:   Physical Exam Vital signs reviewed.  He is afebrile.  Blood pressure 120/70 pulse 88 temperature 98.6 degrees pulse oximetry 98%  Straight leg raising is negative at 90 degrees on the right.  Muscle strength is normal in the right lower extremity.  There is no edema of the extremity.  He has some prominence on the top of his foot along the fourth metatarsal area but recent foot x-ray in April was negative for occult fracture.     Assessment & Plan:  He may have a mild  right lower extremity radiculopathy  Plan: He is reluctant to take anti-inflammatory medications.  His father has a familial chronic kidney disease and is under the care of [facility].  Patient's kidney functions were checked in January 2023 and were completely normal.  When he was having some right knee pain in April 2023 I recommended Voltaren gel.  It may or may not have helped.  I think we should avoid oral NSAIDs at this point with his family history of kidney disease.  I gave him Depo-Medrol 80 mg IM once in buttock today to help with discomfort in right lower extremity.  We discussed referral to orthopedics for evaluation of possible radiculopathy.  He will consider it.  His mother has history of lumbar spinal stenosis and gets epidural steroid injections per Neurosurgery.  He does not want to do physical therapy at this point in time.

## 2022-02-22 NOTE — Patient Instructions (Addendum)
80 mg IM methylprednisolone given in office today for musculoskeletal pain in right buttock and leg.  Okay to use Voltaren gel if needed.  If pain persist, we will consider referral to Orthopedics.  He does not want to do physical therapy at this point in time.

## 2022-06-03 ENCOUNTER — Encounter: Payer: Self-pay | Admitting: Internal Medicine

## 2022-06-03 ENCOUNTER — Ambulatory Visit: Payer: BC Managed Care – PPO | Admitting: Internal Medicine

## 2022-06-03 ENCOUNTER — Telehealth: Payer: Self-pay

## 2022-06-03 VITALS — BP 116/80 | HR 74 | Temp 98.4°F | Ht 69.0 in | Wt 156.8 lb

## 2022-06-03 DIAGNOSIS — R1904 Left lower quadrant abdominal swelling, mass and lump: Secondary | ICD-10-CM | POA: Diagnosis not present

## 2022-06-03 MED ORDER — DICLOFENAC SODIUM 1 % EX GEL: 2.0000 g | Freq: Four times a day (QID) | CUTANEOUS | 3 refills | Status: DC

## 2022-06-03 NOTE — Progress Notes (Signed)
   Subjective:    Patient ID: Jose Phillips, male    DOB: 10-16-1985, 36 y.o.   MRN: [id number]  HPI 36 year old Male seen for a persistent bulge in left lateral abdomen that concerns him. He works for [employer] and is very physically active. He stays in excellent physical shape. There is no pain. He has noticed some asymmetry in what seems to be well developed abdominal musculature.  There is no nausea, vomiting, indigestion.  No issues with bowel movements.  He noticed this change a few months ago.  He has been a patient here since birth in [birth year].  He had an ASD transcatheter closure at age 39 at [facility].  No cardiac issues whatsoever.  No chronic medications.  No known drug allergies.  He eats healthy.  Social history: He is single.  Graduated from [employer] with a concentration in business.  He does not drink alcohol, smoke or use drugs.  Family history: Father with history of hypertension and chronic kidney disease.  Mother with history of lumbar stenosis and impaired glucose tolerance.  1 sister with history of narcolepsy.  Review of Systems see above     Objective:   Physical Exam  Blood pressure 116/80, pulse 74, temperature 98.4 degrees, pulse oximetry 98% weight 156 pounds 12.8 ounces height 5 feet 9 inches, BMI 23.16  Abdomen: Soft nondistended without hepatosplenomegaly or masses.  There is a prominent bulge that I believe is a muscle left lateral abdomen just below his umbilicus.  There is a similar bulge right lateral abdomen but is not quite as prominent.      Assessment & Plan:  ?  Muscular bulge left lower abdomen lateral aspect  Plan: I am going to order an ultrasound of this area to try to reassure him that this is likely benign.  Musculoskeletal pain-refilled Voltaren gel

## 2022-06-03 NOTE — Patient Instructions (Addendum)
I have ordered ultrasound of left lateral lower abdomen to evaluate bulging area that is of concern to patient.  Refill Voltaren gel for musculoskeletal pain at patient request.  Seems to help after exercise.

## 2022-06-03 NOTE — Telephone Encounter (Signed)
Appt scheduled at [place] 06/04/22 @ 11:30 patient informed

## 2022-06-04 ENCOUNTER — Ambulatory Visit
Admission: RE | Admit: 2022-06-04 | Discharge: 2022-06-04 | Disposition: A | Discharge status: Home / Self Care | Payer: BC Managed Care – PPO | Source: Ambulatory Visit | Attending: Internal Medicine | Admitting: Internal Medicine

## 2022-06-07 ENCOUNTER — Encounter: Payer: Self-pay | Admitting: Internal Medicine

## 2022-08-01 ENCOUNTER — Emergency Department (HOSPITAL_COMMUNITY)
Admission: EM | Admit: 2022-08-01 | Discharge: 2022-08-01 | Disposition: A | Discharge status: Home / Self Care | Payer: BC Managed Care – PPO | Attending: Emergency Medicine | Admitting: Emergency Medicine

## 2022-08-01 ENCOUNTER — Other Ambulatory Visit: Payer: Self-pay

## 2022-08-01 ENCOUNTER — Encounter (HOSPITAL_COMMUNITY): Payer: Self-pay

## 2022-08-01 DIAGNOSIS — L989 Disorder of the skin and subcutaneous tissue, unspecified: Secondary | ICD-10-CM | POA: Diagnosis present

## 2022-08-01 NOTE — ED Triage Notes (Signed)
States that he wants a general physical before the new year. Was seen at the urgent care and they told him that he had to make a appointment. He also states that he has a hair bump on his lef tinner theigh and wants that looked at as well.

## 2022-08-01 NOTE — Discharge Instructions (Signed)
As we discussed, your workup in the ER today was reassuring for acute findings.  Please continue to apply antibiotic ointment to your lesion and monitor for signs of healing.  It may take some time for it to completely heal.  Follow-up with your primary care doctor for any additional health needs.  Return if development of any new or worsening symptoms.

## 2022-08-01 NOTE — ED Provider Notes (Signed)
[facility]-EMERGENCY DEPT Provider Note   CSN: [id number] Arrival date & time: 08/01/22  1559     History  Chief Complaint  Patient presents with   Employment Physical   Abscess    Jose Phillips is a 36 y.o. male.  Patient with no pertinent past medical history presents today with complaints of skin lesion. He states that a few days ago he has a small whitehead in his groin area that he popped and scabbed over. States that he picked the scab off and has been applying antibiotic ointment but it has not completely healed yet and he is concerned for STD. Denies any dysuria, abdominal pain, or penile discharge. Also states that he wants a physical exam. Upon further questioning, he states that he just wanted his vital signs checked. He denies fevers or chills.   The history is provided by the patient. No language interpreter was used.  Abscess      Home Medications Prior to Admission medications   Medication Sig Start Date End Date Taking? Authorizing Provider  diclofenac Sodium (VOLTAREN ARTHRITIS PAIN) 1 % GEL Apply 2 g topically 4 (four) times daily. 06/03/22   Margaree Mackintosh, MD      Allergies    Patient has no known allergies.    Review of Systems   Review of Systems  Skin:  Positive for wound.  All other systems reviewed and are negative.   Physical Exam Updated Vital Signs BP 127/83 (BP Location: Left Arm)   Pulse 86   Temp 99.2 F (37.3 C) (Oral)   Resp 18   Ht 5\' 9"  (1.753 m)   Wt 72.5 kg   SpO2 96%   BMI 23.60 kg/m  Physical Exam Vitals and nursing note reviewed.  Constitutional:      General: He is not in acute distress.    Appearance: Normal appearance. He is normal weight. He is not ill-appearing, toxic-appearing or diaphoretic.  HENT:     Head: Normocephalic and atraumatic.  Cardiovascular:     Rate and Rhythm: Normal rate.  Pulmonary:     Effort: Pulmonary effort is normal. No respiratory distress.  Abdominal:      General: Abdomen is flat.     Palpations: Abdomen is soft.     Tenderness: There is no abdominal tenderness.     Comments: Isolated pinpoint lesion noted to the left sided pubic region is not fluctuant or indurated. No tenderness to palpation.  No drainage.  No erythema or warmth. Good granulomatous tissue present.   Musculoskeletal:        General: Normal range of motion.     Cervical back: Normal range of motion.  Skin:    General: Skin is warm and dry.  Neurological:     General: No focal deficit present.     Mental Status: He is alert.  Psychiatric:        Mood and Affect: Mood normal.        Behavior: Behavior normal.     ED Results / Procedures / Treatments   Labs (all labs ordered are listed, but only abnormal results are displayed) Labs Reviewed - No data to display  EKG None  Radiology No results found.  Procedures Procedures    Medications Ordered in ED Medications - No data to display  ED Course/ Medical Decision Making/ A&P  Medical Decision Making  Patient presents today with complaints of skin lesion in the left pubic region x 3 days.  He is afebrile, nontoxic-appearing, and in no acute distress with reassuring vital signs.  Physical exam reveals well-healing isolated and painless skin lesion with good granulomatous tissue present.  No signs of infection, fluctuance, induration, erythema or warmth.  Nonconcerning for STI.  Suspect symptoms from ingrown hair follicle.  Patient also wanted his vital signs checked.  I have informed him that his findings are reassuring.  No further emergent concerns, patient is stable to be discharged.  Patient is understanding and amenable with plan, educated on red flag symptoms of prompt immediate return.  Patient discharged in stable condition.   Final Clinical Impression(s) / ED Diagnoses Final diagnoses:  Skin lesion, superficial    Rx / DC Orders ED Discharge Orders     None     An After  Visit Summary was printed and given to the patient.     Vear Clock 08/01/22 1944    Mardene Sayer, MD 08/01/22 [phone]

## 2023-06-25 IMAGING — CR DG FOOT COMPLETE 3+V*R*
3 series · 3 of 3 positions shown · non-contrast
Comparison: None.

CLINICAL DATA: Right top of foot pain between first and second
metatarsal for approximately 1 month related to an injury.

EXAM:
RIGHT FOOT COMPLETE - 3+ VIEW

[x foot ap right]
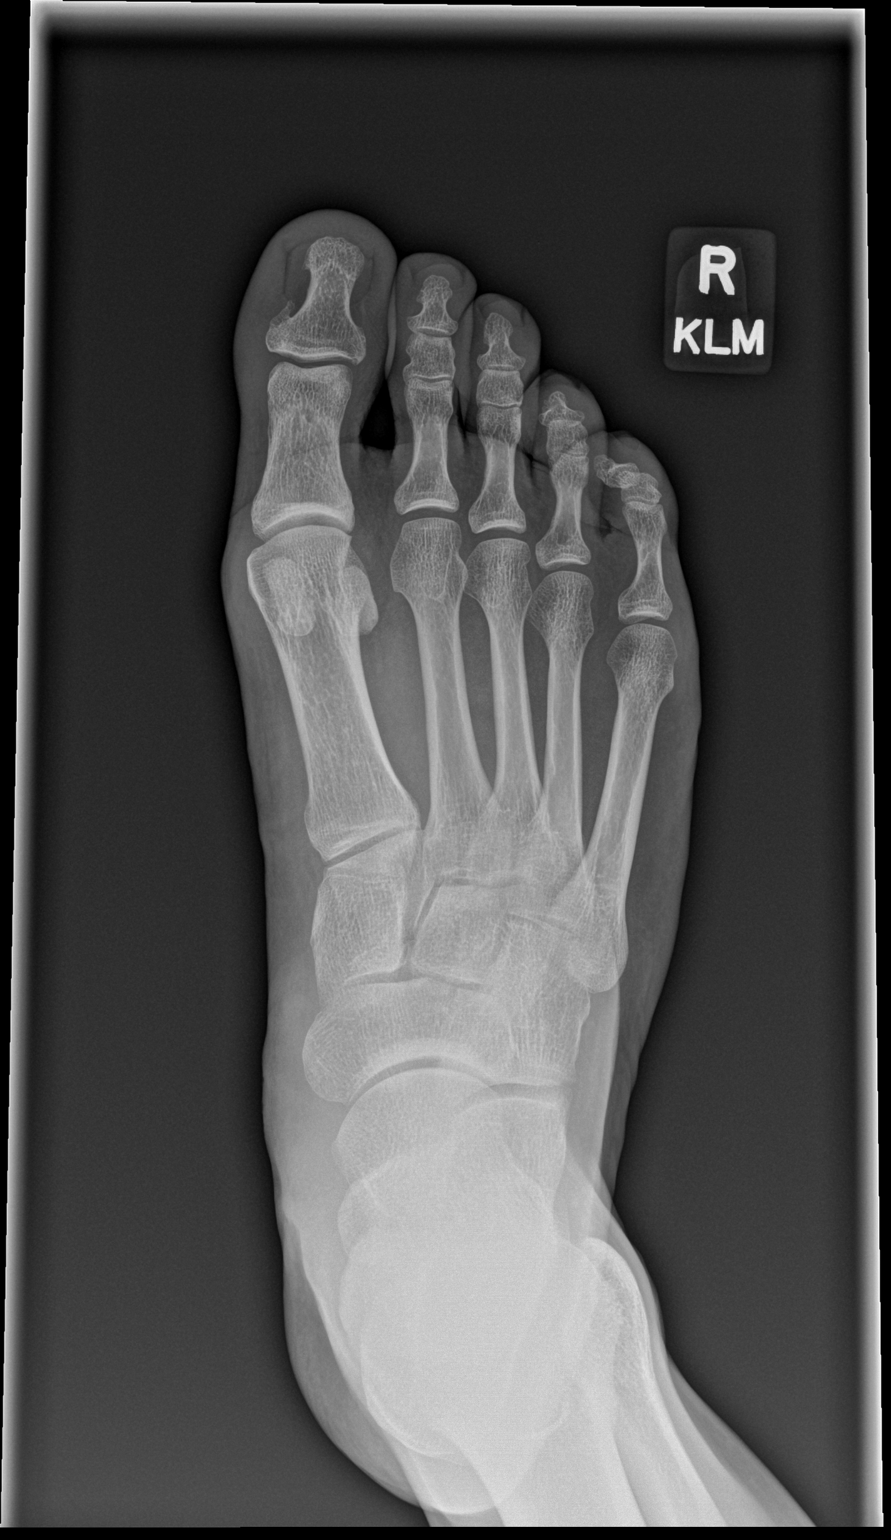

[x foot obl right]
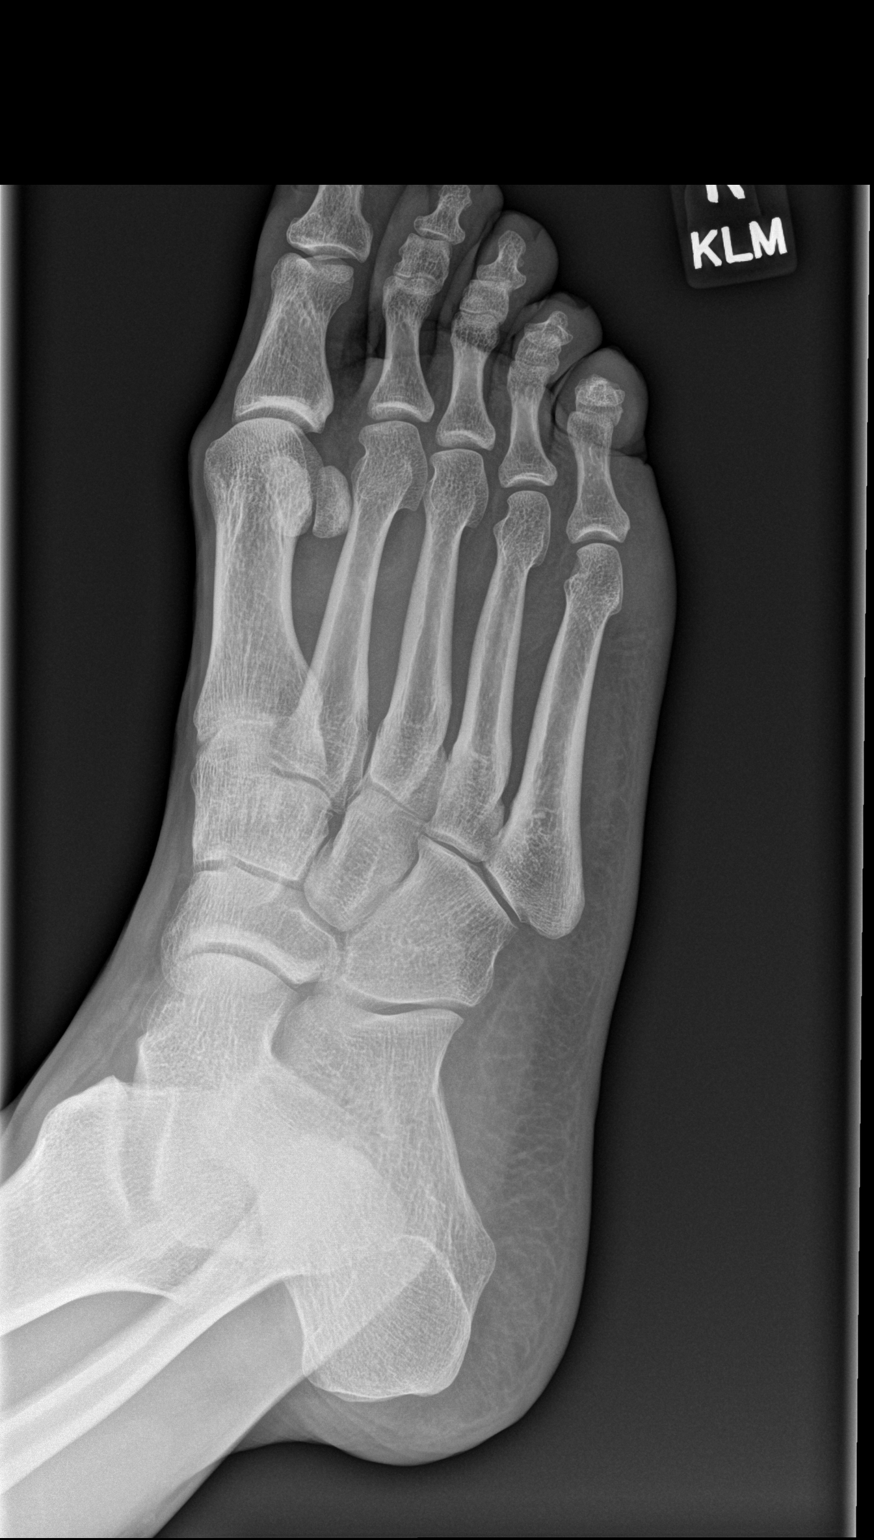

[x foot lat right]
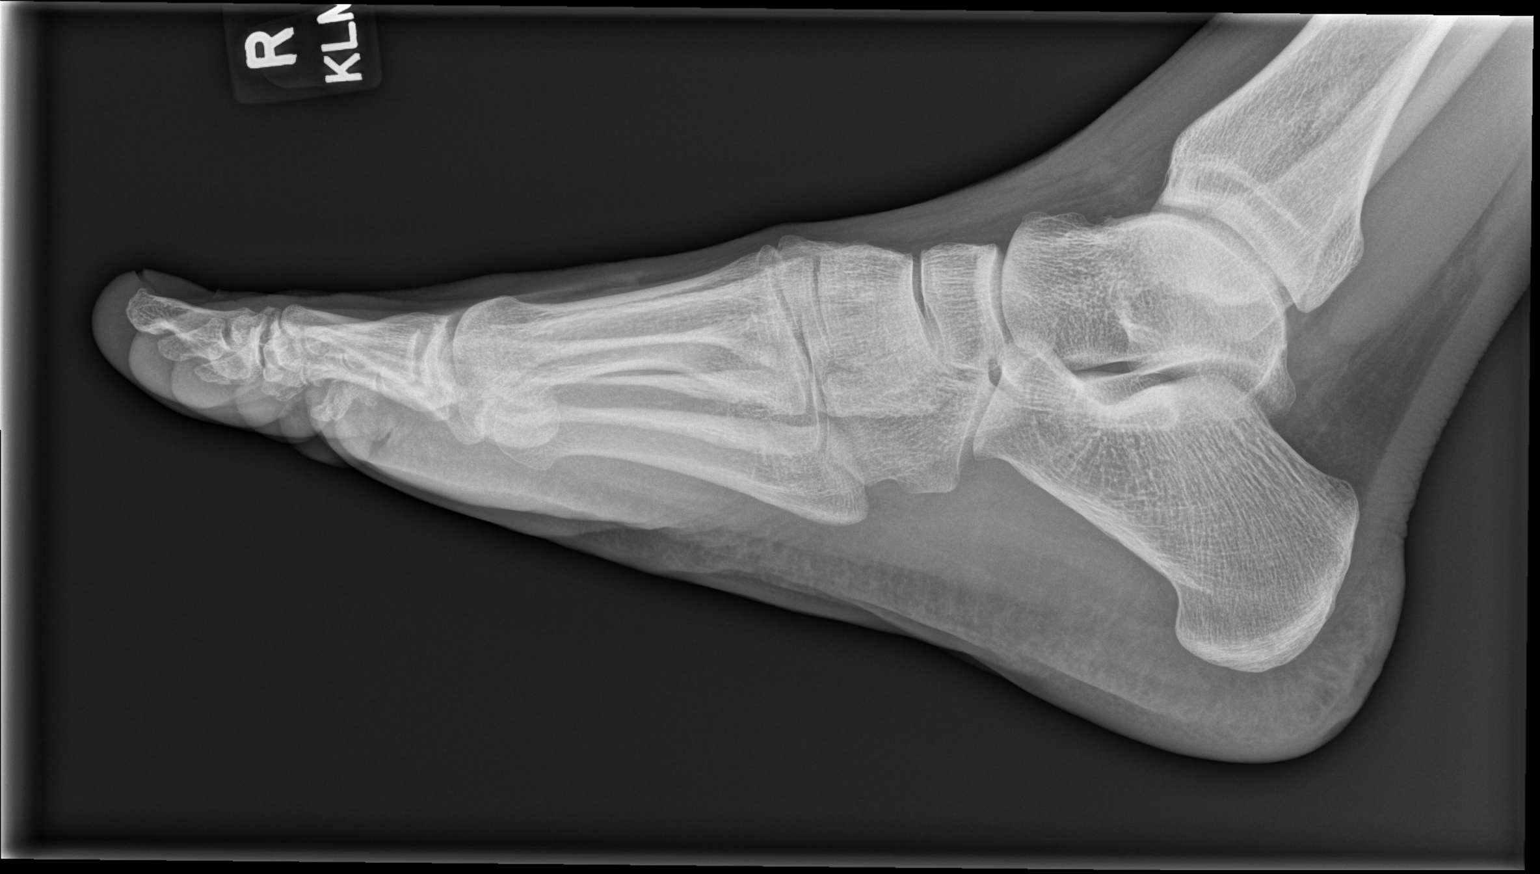

[3 of 3 positions shown; findings below may reference images not displayed]

FINDINGS: Frontal, oblique, and lateral views are obtained. No fracture,
subluxation, or dislocation. Mild midfoot osteoarthritis with
tarsometatarsal joint space narrowing and osteophyte formation. Soft
tissues are unremarkable.
IMPRESSION: 1. Mild midfoot osteoarthritis.
2. No acute bony abnormality.

## 2023-11-01 ENCOUNTER — Emergency Department (HOSPITAL_COMMUNITY)
Admission: EM | Admit: 2023-11-01 | Discharge: 2023-11-01 | Disposition: A | Discharge status: Home / Self Care | Attending: Emergency Medicine | Admitting: Emergency Medicine

## 2023-11-01 DIAGNOSIS — Z202 Contact with and (suspected) exposure to infections with a predominantly sexual mode of transmission: Secondary | ICD-10-CM | POA: Insufficient documentation

## 2023-11-01 LAB — URINALYSIS, ROUTINE W REFLEX MICROSCOPIC
Bacteria, UA: NONE SEEN
Bilirubin Urine: NEGATIVE
Glucose, UA: NEGATIVE mg/dL
Hgb urine dipstick: NEGATIVE
Ketones, ur: NEGATIVE mg/dL
Nitrite: NEGATIVE
Protein, ur: NEGATIVE mg/dL
Specific Gravity, Urine: 1.028 (ref 1.005–1.030)
WBC, UA: >50 WBC/hpf (ref 0–5)
pH: 5 (ref 5.0–8.0)

## 2023-11-01 MED ORDER — DOXYCYCLINE HYCLATE 100 MG PO CAPS: 100.0000 mg | ORAL_CAPSULE | Freq: Two times a day (BID) | ORAL | 0 refills | Status: AC

## 2023-11-01 MED ORDER — LIDOCAINE HCL (PF) 1 % IJ SOLN
INTRAMUSCULAR | Status: AC
  Administered 2023-11-01: 2.1 mL
  Filled 2023-11-01: qty 30

## 2023-11-01 MED ORDER — CEFTRIAXONE SODIUM 1 G IJ SOLR
500.0000 mg | Freq: Once | INTRAMUSCULAR | Status: AC
  Administered 2023-11-01: 500 mg via INTRAMUSCULAR
  Filled 2023-11-01: qty 10

## 2023-11-01 NOTE — Discharge Instructions (Addendum)
 You are seen in the emergency department today for concerns of possible STI exposure.  Your gonorrhea and Chlamydia testing is currently pending.  He received a one-time dose of Rocephin and a prescription for doxycycline is been sent to your pharmacy which you should take twice daily for the next 7 days.  Please take these medications as prescribed return the emergency department for any concerns of new or worsening symptoms.  Otherwise, please follow-up with your primary care provider.

## 2023-11-01 NOTE — ED Provider Notes (Signed)
 Jose Phillips EMERGENCY DEPARTMENT AT [facility] Provider Note   CSN: [id number] Arrival date & time: 11/01/23  0757     History Chief Complaint  Patient presents with   Exposure to STD    [facility] [name] is a 38 y.o. male.  Patient presents to the emergency department with concerns of possible STI exposure.  He states that he woke up this morning with a white penile discharge present.  Denies any dysuria, hematuria, increased urinary frequency or urgency, or any rashes in the pelvis.  States that he does not have any unprotected penetrative vaginal sex but he does report receiving oral sex recently without use of any condom.  He states that he was recently tested for STIs several months ago as well as HIV with negative findings.  He is concerned about the discharge that he fell this morning and was requesting to be tested and treated prophylactically.   Exposure to STD       Home Medications Prior to Admission medications   Medication Sig Start Date End Date Taking? Authorizing Provider  doxycycline (VIBRAMYCIN) 100 MG capsule Take 1 capsule (100 mg total) by mouth 2 (two) times daily for 7 days. 11/01/23 11/08/23 Yes Smitty Knudsen, PA-C  diclofenac Sodium (VOLTAREN ARTHRITIS PAIN) 1 % GEL Apply 2 g topically 4 (four) times daily. 06/03/22   Margaree Mackintosh, MD      Allergies    Patient has no known allergies.    Review of Systems   Review of Systems  Genitourinary:  Positive for penile discharge.  All other systems reviewed and are negative.   Physical Exam Updated Vital Signs BP 129/82   Pulse 83   Temp 98.3 F (36.8 C) (Oral)   Resp 18   Ht 5\' 9"  (1.753 m)   Wt 77.6 kg   SpO2 96%   BMI 25.25 kg/m  Physical Exam Vitals and nursing note reviewed.  Constitutional:      General: He is not in acute distress.    Appearance: He is well-developed.  HENT:     Head: Normocephalic and atraumatic.  Eyes:     Conjunctiva/sclera: Conjunctivae normal.   Cardiovascular:     Rate and Rhythm: Normal rate and regular rhythm.     Heart sounds: No murmur heard. Pulmonary:     Effort: Pulmonary effort is normal. No respiratory distress.     Breath sounds: Normal breath sounds.  Abdominal:     Palpations: Abdomen is soft.     Tenderness: There is no abdominal tenderness.  Genitourinary:    Comments: Patient refused examination Musculoskeletal:        General: No swelling.     Cervical back: Neck supple.  Skin:    General: Skin is warm and dry.     Capillary Refill: Capillary refill takes less than 2 seconds.  Neurological:     Mental Status: He is alert.  Psychiatric:        Mood and Affect: Mood normal.     ED Results / Procedures / Treatments   Labs (all labs ordered are listed, but only abnormal results are displayed) Labs Reviewed  URINALYSIS, ROUTINE W REFLEX MICROSCOPIC - Abnormal; Notable for the following components:      Result Value   Leukocytes,Ua MODERATE (*)    All other components within normal limits  GC/CHLAMYDIA PROBE AMP ([place]) NOT AT [facility]    EKG None  Radiology No results found.  Procedures Procedures  Medications Ordered in ED Medications  cefTRIAXone (ROCEPHIN) injection 500 mg (500 mg Intramuscular Given 11/01/23 0911)  lidocaine (PF) (XYLOCAINE) 1 % injection (2.1 mLs  Given 11/01/23 0912)    ED Course/ Medical Decision Making/ A&P                                 Medical Decision Making Amount and/or Complexity of Data Reviewed Labs: ordered.  Risk Prescription drug management.   This patient presents to the ED for concern of STI exposure.  Differential diagnosis includes gonorrhea, chlamydia, syphilis, trichomonas, HIV   Lab Tests:  I Ordered, and personally interpreted labs.  The pertinent results include: UA negative for infection but moderate leukocytes seen, GC/chlamydia collected and pending   Medicines ordered and prescription drug management:  I ordered  medication including Rocephin for possible STI exposure Reevaluation of the patient after these medicines showed that the patient states that his name I have reviewed the patients home medicines and have made adjustments as needed   Problem List / ED Course:  Patient presents to the ED today with concerns of possible STI exposure.  He reports he woke up this morning with a white penile discharge.  States she has not had any discharge recently.  He does endorse being sexually active with 1 male partner but denies any unprotected sex.  He does report that he received unprotected oral sex recently.  Was tested for STIs several months ago and was negative at that time.  He denies any dysuria, increased urinary frequency or urgency, or urinary hesitancy.  Denies any pain in his testicles or in his abdomen. Physical exam of the pelvis was refused by patient at this time.  Unable to appreciate any specific penile discharge.  No abdominal tenderness. After discussion with patient, he would prefer to be treated prophylactically with testing pending.  Will receive a dose of one-time Rocephin with doxycycline sent for outpatient use for the next 7 days.  Advised patient to take this medication as prescribed return to the emergency department for any concerns or worsening symptoms primarily testicular pain.  Patient will otherwise plan on outpatient follow-up.  Discharged home in stable condition.  Final Clinical Impression(s) / ED Diagnoses Final diagnoses:  Possible exposure to STD    Rx / DC Orders ED Discharge Orders          Ordered    doxycycline (VIBRAMYCIN) 100 MG capsule  2 times daily        11/01/23 0902              Smitty Knudsen, PA-C 11/01/23 1610    Margarita Grizzle, MD 11/06/23 1147

## 2023-11-01 NOTE — ED Triage Notes (Signed)
 Patient c/o waking up this morning with white penile discharge Denies pain, itching, discomfort Denies having unprotected sex Stated he did receive oral sex

## 2023-11-03 LAB — GC/CHLAMYDIA PROBE AMP (~~LOC~~) NOT AT ARMC
Chlamydia: NEGATIVE
Comment: NEGATIVE
Comment: NORMAL
Neisseria Gonorrhea: NEGATIVE

## 2023-12-08 ENCOUNTER — Other Ambulatory Visit

## 2023-12-08 DIAGNOSIS — Z13220 Encounter for screening for lipoid disorders: Secondary | ICD-10-CM

## 2023-12-08 DIAGNOSIS — Z0000 Encounter for general adult medical examination without abnormal findings: Secondary | ICD-10-CM

## 2023-12-09 LAB — CBC WITH DIFFERENTIAL/PLATELET
Absolute Lymphocytes: 1387 {cells}/uL (ref 850–3900)
Absolute Monocytes: 250 {cells}/uL (ref 200–950)
Basophils Absolute: 51 {cells}/uL (ref 0–200)
Basophils Relative: 1 %
Eosinophils Absolute: 153 {cells}/uL (ref 15–500)
Eosinophils Relative: 3 %
HCT: 48.3 % (ref 38.5–50.0)
Hemoglobin: 15.9 g/dL (ref 13.2–17.1)
MCH: 27.5 pg (ref 27.0–33.0)
MCHC: 32.9 g/dL (ref 32.0–36.0)
MCV: 83.6 fL (ref 80.0–100.0)
MPV: 10.1 fL (ref 7.5–12.5)
Monocytes Relative: 4.9 %
Neutro Abs: 3259 {cells}/uL (ref 1500–7800)
Neutrophils Relative %: 63.9 %
Platelets: 251 10*3/uL (ref 140–400)
RBC: 5.78 10*6/uL (ref 4.20–5.80)
RDW: 12.6 % (ref 11.0–15.0)
Total Lymphocyte: 27.2 %
WBC: 5.1 10*3/uL (ref 3.8–10.8)

## 2023-12-09 LAB — COMPLETE METABOLIC PANEL WITHOUT GFR
AG Ratio: 1.9 (calc) (ref 1.0–2.5)
ALT: 19 U/L (ref 9–46)
AST: 23 U/L (ref 10–40)
Albumin: 4.7 g/dL (ref 3.6–5.1)
Alkaline phosphatase (APISO): 62 U/L (ref 36–130)
BUN: 11 mg/dL (ref 7–25)
CO2: 29 mmol/L (ref 20–32)
Calcium: 10 mg/dL (ref 8.6–10.3)
Chloride: 103 mmol/L (ref 98–110)
Creat: 1.19 mg/dL (ref 0.60–1.26)
Globulin: 2.5 g/dL (ref 1.9–3.7)
Glucose, Bld: 110 mg/dL — ABNORMAL HIGH (ref 65–99)
Potassium: 4.2 mmol/L (ref 3.5–5.3)
Sodium: 139 mmol/L (ref 135–146)
Total Bilirubin: 0.5 mg/dL (ref 0.2–1.2)
Total Protein: 7.2 g/dL (ref 6.1–8.1)

## 2023-12-09 LAB — LIPID PANEL
Cholesterol: 151 mg/dL (ref <200)
HDL: 60 mg/dL (ref >=40)
LDL Cholesterol (Calc): 77 mg/dL
Non-HDL Cholesterol (Calc): 91 mg/dL (ref <130)
Total CHOL/HDL Ratio: 2.5 (calc) (ref <5.0)
Triglycerides: 68 mg/dL (ref <150)

## 2023-12-09 NOTE — Progress Notes (Signed)
 Annual Wellness Visit   Patient Care Team: Sylvan Evener, MD as PCP - General (Internal Medicine)  Visit Date: 12/12/23   Chief Complaint  Patient presents with   Annual Exam   Subjective:  Patient: Jose Phillips, Male DOB: Nov 05, 1985, 38 y.o. MRN: [id number] Jose Phillips is a 38 y.o. Male, who has been a patient of this practice since his birth who presents today for his Annual Wellness Visit. Patient has History of Percutaneous Transcatheter Closure of Congenital ASD and History of Migraine Headaches.  History of ASD Transcatheter Closure at [facility] in 2002 at age 38. Has not had any cardiac issues since then.   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 At this time this is not a concern.   History of Migraine Headaches.   Labs 12/05/2023 CBC: WNL CMP, compared to 2023: Glucose 110, elevated from 94; otherwise WNL - glucose elevation due to some Gatorade Lite prior to labs. Lipid Panel: WNL  Colonoscopy will be due 2038.  PSA  will be due 2038.  Vaccine Counseling: Due for Covid-19 and Tdap; UTD on Flu. Past Medical History:  Diagnosis Date   Headache    Medical/Surgical History Narrative:   Allergic/Intolerant to: No Known Allergies  2009 - had Gonorrhea and Chlamydia  1990 - Chickenpox  Other - Surghx of: Hernia Repair Family History  Problem Relation Age of Onset   Migraines Mother    Hypertension Father    Family History Narrative: Father w/ hx of Hypertension and Chronic Kidney Disease Mother w/ hx of Lumbar Stenosis and Migraines 1 Sister w/ hx of Narcolepsy Social History   Social History Narrative   Caffeine- rarely    2025 - Single. Graduated from [employer] with a concentration in business. Has been working at [employer] x17 years and another job doing similar work. Is in excellent physical condition, works out regularly. Eats healthy.  Non-smoker or drinker, and does not use drugs.    Review of Systems  Constitutional:  Negative for chills, fever, malaise/fatigue and weight loss.  HENT:  Negative for hearing loss, sinus pain and sore throat.   Respiratory:  Negative for cough, hemoptysis and shortness of breath.   Cardiovascular:  Negative for chest pain, palpitations, leg swelling and PND.  Gastrointestinal:  Negative for abdominal pain, constipation, diarrhea, heartburn, nausea and vomiting.  Genitourinary:  Negative for dysuria, frequency and urgency.  Musculoskeletal:  Negative for back pain, myalgias and neck pain.  Skin:  Negative for itching and rash.  Neurological:  Negative for dizziness, tingling, seizures and headaches.  Endo/Heme/Allergies:  Negative for polydipsia.  Psychiatric/Behavioral:  Negative for depression. The patient is not nervous/anxious.     Objective:  Vitals: BP 128/88   Pulse (!) 111   Temp 98.2 F (36.8 C) (Temporal)   Ht 5\' 9"  (1.753 m)   Wt 176 lb (79.8 kg)   SpO2 96%   BMI 25.99 kg/m  Physical Exam Vitals and nursing note reviewed.  Constitutional:      General: He is awake. He is not in acute distress.    Appearance: Normal appearance. He is not ill-appearing or toxic-appearing.  HENT:     Head: Normocephalic and atraumatic.     Right Ear: Tympanic membrane, ear canal and external ear normal.     Left Ear: Tympanic membrane, ear canal and external ear normal.     Mouth/Throat:  Dentition: Normal dentition. Does not have dentures. No gingival swelling or dental caries.     Pharynx: Oropharynx is clear.  Eyes:     Extraocular Movements: Extraocular movements intact.     Pupils: Pupils are equal, round, and reactive to light.  Neck:     Thyroid: No thyroid mass, thyromegaly or thyroid tenderness.     Vascular: No carotid bruit.  Cardiovascular:     Rate and Rhythm: Normal rate and regular rhythm. No extrasystoles are present.    Pulses:          Dorsalis pedis pulses  are 1+ on the right side and 1+ on the left side.     Heart sounds: Normal heart sounds. No murmur heard.    No friction rub. No gallop.  Pulmonary:     Effort: Pulmonary effort is normal.     Breath sounds: Normal breath sounds. No decreased breath sounds, wheezing, rhonchi or rales.  Chest:     Chest wall: No mass.  Abdominal:     Palpations: Abdomen is soft. There is no hepatomegaly, splenomegaly or mass.     Tenderness: There is no abdominal tenderness.     Hernia: No hernia is present.  Musculoskeletal:     Cervical back: Normal range of motion.     Right knee: No crepitus.     Left knee: No crepitus.     Right lower leg: No edema.     Left lower leg: No edema.  Lymphadenopathy:     Cervical: No cervical adenopathy.     Upper Body:     Right upper body: No supraclavicular adenopathy.     Left upper body: No supraclavicular adenopathy.  Skin:    General: Skin is warm and dry.  Neurological:     General: No focal deficit present.     Mental Status: He is alert and oriented to person, place, and time. Mental status is at baseline.     Cranial Nerves: Cranial nerves 2-12 are intact.     Sensory: Sensation is intact.     Motor: Motor function is intact.     Coordination: Coordination is intact.     Gait: Gait is intact.     Deep Tendon Reflexes: Reflexes are normal and symmetric.  Psychiatric:        Attention and Perception: Attention normal.        Mood and Affect: Mood normal.        Speech: Speech normal.        Behavior: Behavior normal. Behavior is cooperative.        Thought Content: Thought content normal.        Cognition and Memory: Cognition and memory normal.        Judgment: Judgment normal.   Most Recent Fall Risk Assessment:    12/12/2023    2:13 PM  Fall Risk   Falls in the past year? 0  Number falls in past yr: 0  Injury with Fall? 0  Risk for fall due to : No Fall Risks  Follow up Falls evaluation completed   Most Recent Depression Screenings:     12/12/2023    2:13 PM 06/03/2022   11:47 AM  PHQ 2/9 Scores  PHQ - 2 Score 0 0   Results:  Studies Obtained And Personally Reviewed By Me: Labs:     Component Value Date/Time   NA 139 12/08/2023 1208   K 4.2 12/08/2023 1208   CL 103 12/08/2023 1208  CO2 29 12/08/2023 1208   GLUCOSE 110 (H) 12/08/2023 1208   BUN 11 12/08/2023 1208   CREATININE 1.19 12/08/2023 1208   CALCIUM 10.0 12/08/2023 1208   PROT 7.2 12/08/2023 1208   ALBUMIN 4.2 04/30/2018 0816   AST 23 12/08/2023 1208   ALT 19 12/08/2023 1208   ALKPHOS 57 04/30/2018 0816   BILITOT 0.5 12/08/2023 1208   GFRNONAA >60 06/02/2021 1410   GFRNONAA 82 07/03/2020 1159   GFRAA 95 07/03/2020 1159    Lab Results  Component Value Date   WBC 5.1 12/08/2023   HGB 15.9 12/08/2023   HCT 48.3 12/08/2023   MCV 83.6 12/08/2023   PLT 251 12/08/2023   Lab Results  Component Value Date   CHOL 151 12/08/2023   HDL 60 12/08/2023   LDLCALC 77 12/08/2023   TRIG 68 12/08/2023   CHOLHDL 2.5 12/08/2023   Assessment & Plan:   Other Labs Reviewed today: CBC: WNL CMP, compared to 2023: Glucose 110, elevated from 94; otherwise WNL - glucose elevation due to some Gatorade Lite prior to labs. Lipid Panel: WNL  Urine is concentrated today with SG greater than 1.030 which  is likely why dipstick is showing trace protein   History of ASD Transcatheter Closure at [facility] in 2002 at age 71. Has not had any cardiac issues since then.   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 Not an issue at this time.   Colonoscopy will be due 2038.  PSA  will be due 2038.  Vaccine Counseling: Due for Covid-19 and Tdap; UTD on Flu.      Annual wellness visit done today including the all of the following: Reviewed patient's Family Medical History Reviewed and updated list of patient's medical providers Assessment of cognitive impairment was done Assessed patient's  functional ability Established a written schedule for health screening services Health Risk Assessent Completed and Reviewed  Discussed health benefits of physical activity, and encouraged him to engage in regular exercise appropriate for his age and condition.    I,Emily Lagle,acting as a Neurosurgeon for Sylvan Evener, MD.,have documented all relevant documentation on the behalf of Sylvan Evener, MD,as directed by  Sylvan Evener, MD while in the presence of Sylvan Evener, MD.   I, Sylvan Evener, MD, have reviewed all documentation for this visit. The documentation on 12/12/23 for the exam, diagnosis, procedures, and orders are all accurate and complete.

## 2023-12-12 ENCOUNTER — Ambulatory Visit (INDEPENDENT_AMBULATORY_CARE_PROVIDER_SITE_OTHER): Admitting: Internal Medicine

## 2023-12-12 ENCOUNTER — Encounter: Payer: Self-pay | Admitting: Internal Medicine

## 2023-12-12 VITALS — BP 128/88 | HR 111 | Temp 98.2°F | Ht 69.0 in | Wt 176.0 lb

## 2023-12-12 DIAGNOSIS — Z0000 Encounter for general adult medical examination without abnormal findings: Secondary | ICD-10-CM

## 2023-12-12 LAB — POCT URINALYSIS DIPSTICK
Bilirubin, UA: NEGATIVE
Blood, UA: NEGATIVE
Glucose, UA: NEGATIVE
Ketones, UA: NEGATIVE
Leukocytes, UA: NEGATIVE
Nitrite, UA: NEGATIVE
Protein, UA: POSITIVE — AB
Spec Grav, UA: >=1.03 — AB (ref 1.010–1.025)
Urobilinogen, UA: 1 U/dL
pH, UA: 6 (ref 5.0–8.0)

## 2023-12-12 NOTE — Patient Instructions (Addendum)
 It was a pleasure to see you again today. Labs are normal. Consider tetanus vaccine. Return in one year or as needed.

## 2023-12-20 ENCOUNTER — Emergency Department (HOSPITAL_COMMUNITY): Admission: EM | Admit: 2023-12-20 | Source: Home / Self Care

## 2024-07-27 ENCOUNTER — Ambulatory Visit: Admitting: Internal Medicine

## 2024-07-27 ENCOUNTER — Ambulatory Visit: Payer: Self-pay | Admitting: *Deleted

## 2024-07-27 ENCOUNTER — Encounter: Payer: Self-pay | Admitting: Internal Medicine

## 2024-07-27 ENCOUNTER — Telehealth: Payer: Self-pay | Admitting: Internal Medicine

## 2024-07-27 VITALS — BP 110/80 | HR 82 | Ht 69.0 in | Wt 187.8 lb

## 2024-07-27 DIAGNOSIS — G43011 Migraine without aura, intractable, with status migrainosus: Secondary | ICD-10-CM

## 2024-07-27 MED ORDER — RIZATRIPTAN BENZOATE 10 MG PO TABS: 10.0000 mg | ORAL_TABLET | ORAL | 1 refills | Status: AC | PRN

## 2024-07-27 NOTE — Progress Notes (Signed)
 "   Patient Care Team: Perri Ronal PARAS, MD as PCP - General (Internal Medicine)  Visit Date: 07/27/2024  Subjective:    Patient ID: Jose Phillips , Male   DOB: 13-Jul-1986, 38 y.o.    MRN: [id number]   38 y.o. Male presents today for headache. Had acute headache today and did not go to work. This has him concerned. Tried Tylenol and Excedrin. Tried to rest at home. No vomiting, No photophobia.  Had intractable headache seen in ED November 2022. Was subsequently evaluated by Dr. Rush, Neurologist and was prescribed Ketorolac  10 mg up to 3 times daily.  He's general health is excellent. He exercises and eats well.    History of Migraine headaches. Was seen by [facility] in 2022. Was seen in this office on 06/05/2021 for migraine headaches for which he was given Prednisone  10 mg  and Maxalt  10 mg . He denies any stress that would have triggered headache. Denies fever, chills, or nausea. His headaches started Fall of 2022.At the time he had a friend pass away that may have contributed to the headache  He is concerned now because the headaches have been lasting several days and are not relieved by Excedrin.   No chronic medications. No known drug allergies.     He had bilateral inguinal hernia repair October 1989.    He had an ASD transcatheter closure at age 26 at [facility].  History of Mycosis fungoides diagnosed at [facility] in 2009. History of hypopigmented patches on his body.   Social Hx: Single. Has one sister. Works for [employer]. Is in excellent physical condition.Nonsmoker. No alcohol consumption. Parents are divorced but both living.  Family Hx: Mother with DM. Father with ESRD     Past Medical History:  Diagnosis Date   Headache      Family History  Problem Relation Age of Onset   Migraines Mother    Hypertension Father   Father with history of hypertension and chronic kidney disease as well as history of GIST tumor of the stomach. Sister with history  of narcolepsy. Mother with history of lumbar stenosis. Mother with history of migraines.   Social History   Social History Narrative   Caffeine- rarely    2025 - Single. Graduated from [employer] with a concentration in business. Has been working at [employer] x17 years and another job doing similar work. Is in excellent physical condition, works out regularly. Eats healthy. Non-smoker or drinker, and does not use drugs.       Review of Systems  Neurological:  Positive for headaches.        Objective:   Vitals: BP 110/80   Pulse 82   Ht 5' 9 (1.753 m)   Wt 187 lb 12 oz (85.2 kg)   SpO2 97%   BMI 27.73 kg/m    Physical Exam Vitals and nursing note reviewed.  Constitutional:      General: He is not in acute distress.    Appearance: Normal appearance. He is not ill-appearing.  HENT:     Head: Normocephalic and atraumatic.     Right Ear: Tympanic membrane, ear canal and external ear normal.     Left Ear: Tympanic membrane, ear canal and external ear normal.     Mouth/Throat:     Mouth: Mucous membranes are moist.     Pharynx: Oropharynx is clear. No oropharyngeal exudate or posterior oropharyngeal erythema.  Pulmonary:     Effort: Pulmonary effort  is normal.     Breath sounds: Normal breath sounds. No wheezing, rhonchi or rales.  Lymphadenopathy:     Cervical: No cervical adenopathy.  Skin:    General: Skin is warm and dry.  Neurological:     Mental Status: He is alert and oriented to person, place, and time. Mental status is at baseline.  Psychiatric:        Mood and Affect: Mood normal.        Behavior: Behavior normal.        Thought Content: Thought content normal.        Judgment: Judgment normal.       Results:     Labs:       Component Value Date/Time   NA 139 12/08/2023 1208   K 4.2 12/08/2023 1208   CL 103 12/08/2023 1208   CO2 29 12/08/2023 1208   GLUCOSE 110 (H) 12/08/2023 1208   BUN 11 12/08/2023 1208   CREATININE 1.19  12/08/2023 1208   CALCIUM 10.0 12/08/2023 1208   PROT 7.2 12/08/2023 1208   ALBUMIN 4.2 04/30/2018 0816   AST 23 12/08/2023 1208   ALT 19 12/08/2023 1208   ALKPHOS 57 04/30/2018 0816   BILITOT 0.5 12/08/2023 1208   GFRNONAA >60 06/02/2021 1410   GFRNONAA 82 07/03/2020 1159   GFRAA 95 07/03/2020 1159     Lab Results  Component Value Date   WBC 5.1 12/08/2023   HGB 15.9 12/08/2023   HCT 48.3 12/08/2023   MCV 83.6 12/08/2023   PLT 251 12/08/2023    Lab Results  Component Value Date   CHOL 151 12/08/2023   HDL 60 12/08/2023   LDLCALC 77 12/08/2023   TRIG 68 12/08/2023   CHOLHDL 2.5 12/08/2023        Assessment & Plan:     Meds ordered this encounter  Medications   rizatriptan  (MAXALT ) 10 MG tablet    Sig: Take 1 tablet (10 mg total) by mouth as needed for migraine. May repeat in 2 hours if needed    Dispense:  6 tablet    Refill:  1    Migraine headaches: Was seen by [facility] in 2022. Dx with Migraine without aura by Dr. Rush. Treated with oral Toradol . Was seen in this office on 06/05/2021 for migraine headaches for which he was given Prednisone  10 mg taper  and Maxalt  10 mg . He denies any stress that would have triggered headache. Denies fever, chills, or nausea. His headaches started Fall of 2022.At the time, he had a friend pass away that may have contributed to the headache    He is concerned now because the headaches have been lasting several days and are not relieved by Excedrin.    Maxalt  10 mg as needed for headaches, may repeat in 2 hours prescribed.     I,Makayla C Reid,acting as a scribe for Ronal JINNY Hailstone, MD.,have documented all relevant documentation on the behalf of Ronal JINNY Hailstone, MD,as directed by  Ronal JINNY Hailstone, MD while in the presence of Ronal JINNY Hailstone, MD.  I, Ronal JINNY Hailstone, MD, have reviewed all documentation for this visit. The documentation on 07/27/2024 for the exam, diagnosis, procedures, and orders are all accurate and  complete.     "

## 2024-07-27 NOTE — Telephone Encounter (Signed)
 Done

## 2024-07-27 NOTE — Telephone Encounter (Signed)
 FYI Only or Action Required?: Action required by provider: request for appointment, update on patient condition, and requesting to be seen due to sx returned and has been seen for headaches before.  Patient was last seen in primary care on 12/12/2023 by Perri Ronal PARAS, MD.  Called Nurse Triage reporting Headache.  Symptoms began a week ago.  Interventions attempted: OTC medications: tylenol and has taken 3 excederin today and still having sx and Rest, hydration, or home remedies.  Symptoms are: gradually worsening.  Triage Disposition: See HCP Within 4 Hours (Or PCP Triage)  Patient/caregiver understands and will follow disposition?: No, wishes to speak with PCP    No available appt until 08/02/24. Recommended UC and unsure patient will go . Patient requesting to see PCP.  CAL [employer].           Copied from CRM #[id number]. Topic: Clinical - Red Word Triage >> Jul 27, 2024  8:46 AM Jose Phillips wrote: Red Word that prompted transfer to Nurse Triage: Severe headache for the last week. Right eye area. Reason for Disposition  [1] SEVERE headache (e.g., excruciating) AND [2] not improved after 2 hours of pain medicine  Answer Assessment - Initial Assessment Questions No available appt. Recommended UC /ED. Patient reports he would like appt with PCP and has been seen for headaches before. Unsure patient will go to [facility] or ED.       1. LOCATION: Where does it hurt?      Right eye area 2. ONSET: When did the headache start? (e.g., minutes, hours, days)      Started 1 week ago  3. PATTERN: Does the pain come and go, or has it been constant since it started?     6 am lasting until taking 3 excederin 4. SEVERITY: How bad is the pain? and What does it keep you from doing?  (e.g., Scale 1-10; mild, moderate, or severe)  Did not rate 5. RECURRENT SYMPTOM: Have you ever had headaches before? If Yes, ask: When was the last time? and What happened that time?       Yes reports PCP knows him well 6. CAUSE: What do you think is causing the headache?     Not sure  7. MIGRAINE: Have you been diagnosed with migraine headaches? If Yes, ask: Is this headache similar?      na 8. HEAD INJURY: Has there been any recent injury to your head?      na 9. OTHER SYMPTOMS: Do you have any other symptoms? (e.g., fever, stiff neck, eye pain, sore throat, cold symptoms)     Right eye area to back of head, occurs every am x 1 week. No blurred vision no weakness either side of body. No passing out 10. PREGNANCY: Is there any chance you are pregnant? When was your last menstrual period?       na  Protocols used: Headache-A-AH

## 2024-07-27 NOTE — Patient Instructions (Addendum)
 Please try Maxalt  for headache.     Take at onset of headache. Call if headaches persist.

## 2024-07-27 NOTE — Telephone Encounter (Signed)
 Spoke with patient, advised we are booked for the rest of the day. Advised patient he needs to go to urgent care or ED. Patient stated he wanted to see Dr. Perri on the 29th. I have booked him an appointment for the 29th.

## 2024-08-02 ENCOUNTER — Ambulatory Visit: Admitting: Internal Medicine

## 2024-09-07 ENCOUNTER — Encounter: Payer: Self-pay | Admitting: Internal Medicine

## 2024-09-07 ENCOUNTER — Ambulatory Visit: Admitting: Internal Medicine

## 2024-09-07 VITALS — BP 120/80 | HR 96 | Ht 69.0 in | Wt 190.0 lb

## 2024-09-07 DIAGNOSIS — M79605 Pain in left leg: Secondary | ICD-10-CM

## 2024-09-07 DIAGNOSIS — Z8669 Personal history of other diseases of the nervous system and sense organs: Secondary | ICD-10-CM

## 2024-09-07 MED ORDER — MELOXICAM 15 MG PO TABS: 15.0000 mg | ORAL_TABLET | Freq: Every day | ORAL | 0 refills | Status: AC

## 2024-09-07 NOTE — Patient Instructions (Signed)
 Prescribed Meloxicam  15 mg daily as needed for left leg pain. Once again explained this may be lumbar radiculopathy and needs further evaluation.Would like for him to have Neurology consult but he declines.

## 2024-09-09 ENCOUNTER — Encounter: Payer: Self-pay | Admitting: Internal Medicine
